# Patient Record
Sex: Male | Born: 1975
Health system: Southern US, Community
[De-identification: ages and names within clinical notes are randomized; demographics above are authoritative.]

## PROBLEM LIST (undated history)

## (undated) DIAGNOSIS — F329 Major depressive disorder, single episode, unspecified: Secondary | ICD-10-CM

## (undated) DIAGNOSIS — E079 Disorder of thyroid, unspecified: Secondary | ICD-10-CM

## (undated) DIAGNOSIS — E039 Hypothyroidism, unspecified: Secondary | ICD-10-CM

## (undated) DIAGNOSIS — E119 Type 2 diabetes mellitus without complications: Secondary | ICD-10-CM

## (undated) DIAGNOSIS — I10 Essential (primary) hypertension: Secondary | ICD-10-CM

## (undated) DIAGNOSIS — F32A Depression, unspecified: Secondary | ICD-10-CM

## (undated) DIAGNOSIS — F909 Attention-deficit hyperactivity disorder, unspecified type: Secondary | ICD-10-CM

## (undated) DIAGNOSIS — E785 Hyperlipidemia, unspecified: Secondary | ICD-10-CM

## (undated) HISTORY — DX: Type 2 diabetes mellitus without complications: E11.9

## (undated) HISTORY — DX: Disorder of thyroid, unspecified: E07.9

## (undated) HISTORY — DX: Essential (primary) hypertension: I10

## (undated) HISTORY — DX: Hyperlipidemia, unspecified: E78.5

## (undated) HISTORY — PX: HERNIA REPAIR: SHX51

---
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
## 2013-07-10 ENCOUNTER — Encounter (INDEPENDENT_AMBULATORY_CARE_PROVIDER_SITE_OTHER): Payer: Self-pay | Admitting: Surgery

## 2013-07-10 ENCOUNTER — Ambulatory Visit (INDEPENDENT_AMBULATORY_CARE_PROVIDER_SITE_OTHER): Payer: BC Managed Care – PPO | Admitting: Surgery

## 2013-07-10 VITALS — BP 130/78 | HR 80 | Temp 97.8°F | Resp 16 | Ht 75.0 in | Wt 298.4 lb

## 2013-07-10 DIAGNOSIS — K429 Umbilical hernia without obstruction or gangrene: Secondary | ICD-10-CM

## 2013-07-10 NOTE — Progress Notes (Signed)
Patient ID: Derek Prince Forand, male   DOB: 01/12/1976, 38 y.o.   MRN: [id number]  Chief Complaint  Patient presents with  . Umbilical Hernia    HPI Derek Prince Caudell is a 38 y.o. male.  Patient sent at the request of Dr. Kateri PlummerMorrow for umbilical hernia. Patient has had hernia for a number of years. Is getting larger. He does notice a little clear drainage from it. It causes mild to moderate discomfort. The patient umbilicus. 3/10 pain made worse with coughing or sneezing. HPI  Past Medical History  Diagnosis Date  . Thyroid disease     History reviewed. No pertinent past surgical history.  History reviewed. No pertinent family history.  Social History History  Substance Use Topics  . Smoking status: Current Every Day Smoker  . Smokeless tobacco: Not on file  . Alcohol Use: Yes    No Known Allergies  Current Outpatient Prescriptions  Medication Sig Dispense Refill  . levothyroxine (SYNTHROID, LEVOTHROID) 200 MCG tablet       . lisdexamfetamine (VYVANSE) 30 MG capsule Take 30 mg by mouth daily.       No current facility-administered medications for this visit.    Review of Systems Review of Systems  Constitutional: Negative for fever, chills and unexpected weight change.  HENT: Negative for congestion, hearing loss, sore throat, trouble swallowing and voice change.   Eyes: Negative for visual disturbance.  Respiratory: Negative for cough and wheezing.   Cardiovascular: Negative for chest pain, palpitations and leg swelling.  Gastrointestinal: Negative for nausea, vomiting, abdominal pain, diarrhea, constipation, blood in stool, abdominal distention, anal bleeding and rectal pain.  Genitourinary: Negative for hematuria and difficulty urinating.  Musculoskeletal: Negative for arthralgias.  Skin: Negative for rash and wound.  Neurological: Negative for seizures, syncope, weakness and headaches.  Hematological: Negative for adenopathy. Does not bruise/bleed easily.    Psychiatric/Behavioral: Negative for confusion.    Blood pressure 130/78, pulse 80, temperature 97.8 F (36.6 C), resp. rate 16, height 6\' 3"  (1.905 m), weight 298 lb 6.4 oz (135.353 kg).  Physical Exam Physical Exam  Constitutional: He is oriented to person, place, and time. He appears well-developed and well-nourished.  HENT:  Head: Normocephalic and atraumatic.  Eyes: Pupils are equal, round, and reactive to light. No scleral icterus.  Neck: Normal range of motion. Neck supple.  Cardiovascular: Normal rate and regular rhythm.   Pulmonary/Chest: Effort normal and breath sounds normal.  Abdominal: Soft. There is no tenderness.    Musculoskeletal: Normal range of motion.  Neurological: He is alert and oriented to person, place, and time.  Skin: Skin is warm.  Psychiatric: He has a normal mood and affect. His behavior is normal. Judgment and thought content normal.    Data Reviewed none  Assessment    Umbilical hernia reducible and symptomatic    Plan    Recommend repair of umbilical hernia to  the patient. Patient states he has no one to bring him or stay with him. I recommend that someone take him,  Bring  Him home and stay with him at home. He states he cannot do that.  I offered overnight admission to him and he refused.  He chose not to repair the  hernia at this time.  Information about hernias given which include signs of incarceration or strangulation and what to do if symptoms return.  Pt told to contact office if he changes his mind.        Marshawn Ninneman A. Cheryel Kyte 07/10/2013, 10:38 AM

## 2013-07-10 NOTE — Patient Instructions (Signed)
Hernia A hernia occurs when an internal organ pushes out through a weak spot in the abdominal wall. Hernias most commonly occur in the groin and around the navel. Hernias often can be pushed back into place (reduced). Most hernias tend to get worse over time. Some abdominal hernias can get stuck in the opening (irreducible or incarcerated hernia) and cannot be reduced. An irreducible abdominal hernia which is tightly squeezed into the opening is at risk for impaired blood supply (strangulated hernia). A strangulated hernia is a medical emergency. Because of the risk for an irreducible or strangulated hernia, surgery may be recommended to repair a hernia. CAUSES   Heavy lifting.  Prolonged coughing.  Straining to have a bowel movement.  A cut (incision) made during an abdominal surgery. HOME CARE INSTRUCTIONS   Bed rest is not required. You may continue your normal activities.  Avoid lifting more than 10 pounds (4.5 kg) or straining.  Cough gently. If you are a smoker it is best to stop. Even the best hernia repair can break down with the continual strain of coughing. Even if you do not have your hernia repaired, a cough will continue to aggravate the problem.  Do not wear anything tight over your hernia. Do not try to keep it in with an outside bandage or truss. These can damage abdominal contents if they are trapped within the hernia sac.  Eat a normal diet.  Avoid constipation. Straining over long periods of time will increase hernia size and encourage breakdown of repairs. If you cannot do this with diet alone, stool softeners may be used. SEEK IMMEDIATE MEDICAL CARE IF:   You have a fever.  You develop increasing abdominal pain.  You feel nauseous or vomit.  Your hernia is stuck outside the abdomen, looks discolored, feels hard, or is tender.  You have any changes in your bowel habits or in the hernia that are unusual for you.  You have increased pain or swelling around the  hernia.  You cannot push the hernia back in place by applying gentle pressure while lying down. MAKE SURE YOU:   Understand these instructions.  Will watch your condition.  Will get help right away if you are not doing well or get worse. Document Released: 02/26/2005 Document Revised: 05/21/2011 Document Reviewed: 10/16/2007 Exit[employer] Patient Information 2014 [employer].  

## 2015-02-28 ENCOUNTER — Other Ambulatory Visit: Payer: Self-pay | Admitting: Surgery

## 2015-06-21 DIAGNOSIS — K429 Umbilical hernia without obstruction or gangrene: Secondary | ICD-10-CM | POA: Diagnosis not present

## 2015-06-21 DIAGNOSIS — K420 Umbilical hernia with obstruction, without gangrene: Secondary | ICD-10-CM | POA: Diagnosis not present

## 2015-07-30 DIAGNOSIS — M545 Low back pain: Secondary | ICD-10-CM | POA: Diagnosis not present

## 2015-07-30 DIAGNOSIS — E559 Vitamin D deficiency, unspecified: Secondary | ICD-10-CM | POA: Diagnosis not present

## 2015-07-30 DIAGNOSIS — Z0000 Encounter for general adult medical examination without abnormal findings: Secondary | ICD-10-CM | POA: Diagnosis not present

## 2015-07-30 DIAGNOSIS — R5383 Other fatigue: Secondary | ICD-10-CM | POA: Diagnosis not present

## 2015-07-30 DIAGNOSIS — R0602 Shortness of breath: Secondary | ICD-10-CM | POA: Diagnosis not present

## 2015-08-01 DIAGNOSIS — M549 Dorsalgia, unspecified: Secondary | ICD-10-CM | POA: Diagnosis not present

## 2015-08-01 DIAGNOSIS — R0602 Shortness of breath: Secondary | ICD-10-CM | POA: Diagnosis not present

## 2015-08-01 DIAGNOSIS — R197 Diarrhea, unspecified: Secondary | ICD-10-CM | POA: Diagnosis not present

## 2015-08-02 ENCOUNTER — Other Ambulatory Visit: Payer: Self-pay | Admitting: Family Medicine

## 2015-08-02 ENCOUNTER — Ambulatory Visit
Admission: RE | Admit: 2015-08-02 | Discharge: 2015-08-02 | Disposition: A | Discharge status: Home / Self Care | Payer: BLUE CROSS/BLUE SHIELD | Source: Ambulatory Visit | Attending: Family Medicine | Admitting: Family Medicine

## 2015-08-02 DIAGNOSIS — M549 Dorsalgia, unspecified: Secondary | ICD-10-CM | POA: Diagnosis not present

## 2015-08-02 DIAGNOSIS — R197 Diarrhea, unspecified: Secondary | ICD-10-CM | POA: Diagnosis not present

## 2015-08-02 DIAGNOSIS — R0602 Shortness of breath: Secondary | ICD-10-CM

## 2015-08-02 MED ORDER — IOPAMIDOL (ISOVUE-370) INJECTION 76%
100.0000 mL | Freq: Once | INTRAVENOUS | Status: AC | PRN
  Administered 2015-08-02: 100 mL via INTRAVENOUS

## 2015-08-26 DIAGNOSIS — M549 Dorsalgia, unspecified: Secondary | ICD-10-CM | POA: Diagnosis not present

## 2015-08-26 DIAGNOSIS — Z7984 Long term (current) use of oral hypoglycemic drugs: Secondary | ICD-10-CM | POA: Diagnosis not present

## 2015-08-26 DIAGNOSIS — E1165 Type 2 diabetes mellitus with hyperglycemia: Secondary | ICD-10-CM | POA: Diagnosis not present

## 2015-08-26 DIAGNOSIS — E039 Hypothyroidism, unspecified: Secondary | ICD-10-CM | POA: Diagnosis not present

## 2015-12-02 DIAGNOSIS — Z23 Encounter for immunization: Secondary | ICD-10-CM | POA: Diagnosis not present

## 2015-12-02 DIAGNOSIS — F909 Attention-deficit hyperactivity disorder, unspecified type: Secondary | ICD-10-CM | POA: Diagnosis not present

## 2015-12-02 DIAGNOSIS — E039 Hypothyroidism, unspecified: Secondary | ICD-10-CM | POA: Diagnosis not present

## 2015-12-02 DIAGNOSIS — Z7984 Long term (current) use of oral hypoglycemic drugs: Secondary | ICD-10-CM | POA: Diagnosis not present

## 2015-12-02 DIAGNOSIS — E1165 Type 2 diabetes mellitus with hyperglycemia: Secondary | ICD-10-CM | POA: Diagnosis not present

## 2016-01-03 DIAGNOSIS — F4323 Adjustment disorder with mixed anxiety and depressed mood: Secondary | ICD-10-CM | POA: Diagnosis not present

## 2016-01-24 DIAGNOSIS — F4323 Adjustment disorder with mixed anxiety and depressed mood: Secondary | ICD-10-CM | POA: Diagnosis not present

## 2016-01-26 DIAGNOSIS — E1165 Type 2 diabetes mellitus with hyperglycemia: Secondary | ICD-10-CM | POA: Diagnosis not present

## 2016-01-26 DIAGNOSIS — F909 Attention-deficit hyperactivity disorder, unspecified type: Secondary | ICD-10-CM | POA: Diagnosis not present

## 2016-01-26 DIAGNOSIS — E039 Hypothyroidism, unspecified: Secondary | ICD-10-CM | POA: Diagnosis not present

## 2016-01-26 DIAGNOSIS — Z7984 Long term (current) use of oral hypoglycemic drugs: Secondary | ICD-10-CM | POA: Diagnosis not present

## 2016-02-07 DIAGNOSIS — F4323 Adjustment disorder with mixed anxiety and depressed mood: Secondary | ICD-10-CM | POA: Diagnosis not present

## 2016-05-03 DIAGNOSIS — R52 Pain, unspecified: Secondary | ICD-10-CM | POA: Diagnosis not present

## 2016-05-03 DIAGNOSIS — F909 Attention-deficit hyperactivity disorder, unspecified type: Secondary | ICD-10-CM | POA: Diagnosis not present

## 2016-05-03 DIAGNOSIS — E1165 Type 2 diabetes mellitus with hyperglycemia: Secondary | ICD-10-CM | POA: Diagnosis not present

## 2016-05-03 DIAGNOSIS — Z7984 Long term (current) use of oral hypoglycemic drugs: Secondary | ICD-10-CM | POA: Diagnosis not present

## 2016-05-03 DIAGNOSIS — E039 Hypothyroidism, unspecified: Secondary | ICD-10-CM | POA: Diagnosis not present

## 2016-05-15 DIAGNOSIS — M67814 Other specified disorders of tendon, left shoulder: Secondary | ICD-10-CM | POA: Diagnosis not present

## 2016-05-28 DIAGNOSIS — F411 Generalized anxiety disorder: Secondary | ICD-10-CM | POA: Diagnosis not present

## 2016-05-29 DIAGNOSIS — F411 Generalized anxiety disorder: Secondary | ICD-10-CM | POA: Diagnosis not present

## 2016-05-30 ENCOUNTER — Encounter: Payer: Self-pay | Admitting: Endocrinology

## 2016-05-30 ENCOUNTER — Ambulatory Visit: Payer: BLUE CROSS/BLUE SHIELD | Admitting: Endocrinology

## 2016-05-30 DIAGNOSIS — M25512 Pain in left shoulder: Secondary | ICD-10-CM | POA: Diagnosis not present

## 2016-05-30 DIAGNOSIS — Z765 Malingerer [conscious simulation]: Secondary | ICD-10-CM | POA: Diagnosis not present

## 2016-05-30 DIAGNOSIS — E119 Type 2 diabetes mellitus without complications: Secondary | ICD-10-CM | POA: Diagnosis not present

## 2016-06-11 DIAGNOSIS — F411 Generalized anxiety disorder: Secondary | ICD-10-CM | POA: Diagnosis not present

## 2016-06-12 DIAGNOSIS — F411 Generalized anxiety disorder: Secondary | ICD-10-CM | POA: Diagnosis not present

## 2016-06-25 DIAGNOSIS — F4323 Adjustment disorder with mixed anxiety and depressed mood: Secondary | ICD-10-CM | POA: Diagnosis not present

## 2016-06-26 DIAGNOSIS — F4323 Adjustment disorder with mixed anxiety and depressed mood: Secondary | ICD-10-CM | POA: Diagnosis not present

## 2016-07-09 DIAGNOSIS — F411 Generalized anxiety disorder: Secondary | ICD-10-CM | POA: Diagnosis not present

## 2016-07-10 DIAGNOSIS — F411 Generalized anxiety disorder: Secondary | ICD-10-CM | POA: Diagnosis not present

## 2016-08-10 DIAGNOSIS — E785 Hyperlipidemia, unspecified: Secondary | ICD-10-CM | POA: Diagnosis not present

## 2016-08-10 DIAGNOSIS — F909 Attention-deficit hyperactivity disorder, unspecified type: Secondary | ICD-10-CM | POA: Diagnosis not present

## 2016-08-10 DIAGNOSIS — E1165 Type 2 diabetes mellitus with hyperglycemia: Secondary | ICD-10-CM | POA: Diagnosis not present

## 2016-08-10 DIAGNOSIS — E039 Hypothyroidism, unspecified: Secondary | ICD-10-CM | POA: Diagnosis not present

## 2016-08-28 ENCOUNTER — Other Ambulatory Visit: Payer: Self-pay | Admitting: Family Medicine

## 2016-08-28 DIAGNOSIS — R911 Solitary pulmonary nodule: Secondary | ICD-10-CM

## 2016-09-03 ENCOUNTER — Other Ambulatory Visit: Payer: BLUE CROSS/BLUE SHIELD

## 2016-10-26 DIAGNOSIS — E119 Type 2 diabetes mellitus without complications: Secondary | ICD-10-CM | POA: Diagnosis not present

## 2016-10-26 DIAGNOSIS — N529 Male erectile dysfunction, unspecified: Secondary | ICD-10-CM | POA: Diagnosis not present

## 2016-10-26 DIAGNOSIS — R079 Chest pain, unspecified: Secondary | ICD-10-CM | POA: Diagnosis not present

## 2016-10-26 DIAGNOSIS — F064 Anxiety disorder due to known physiological condition: Secondary | ICD-10-CM | POA: Diagnosis not present

## 2016-11-02 DIAGNOSIS — E039 Hypothyroidism, unspecified: Secondary | ICD-10-CM | POA: Diagnosis not present

## 2016-11-02 DIAGNOSIS — E785 Hyperlipidemia, unspecified: Secondary | ICD-10-CM | POA: Diagnosis not present

## 2016-11-02 DIAGNOSIS — Z23 Encounter for immunization: Secondary | ICD-10-CM | POA: Diagnosis not present

## 2016-11-02 DIAGNOSIS — E1165 Type 2 diabetes mellitus with hyperglycemia: Secondary | ICD-10-CM | POA: Diagnosis not present

## 2016-12-05 DIAGNOSIS — E669 Obesity, unspecified: Secondary | ICD-10-CM | POA: Insufficient documentation

## 2016-12-05 DIAGNOSIS — E1165 Type 2 diabetes mellitus with hyperglycemia: Secondary | ICD-10-CM | POA: Insufficient documentation

## 2016-12-26 DIAGNOSIS — E039 Hypothyroidism, unspecified: Secondary | ICD-10-CM | POA: Diagnosis not present

## 2016-12-26 DIAGNOSIS — L309 Dermatitis, unspecified: Secondary | ICD-10-CM | POA: Diagnosis not present

## 2016-12-26 DIAGNOSIS — E1165 Type 2 diabetes mellitus with hyperglycemia: Secondary | ICD-10-CM | POA: Diagnosis not present

## 2017-02-05 DIAGNOSIS — E1165 Type 2 diabetes mellitus with hyperglycemia: Secondary | ICD-10-CM | POA: Diagnosis not present

## 2017-02-05 DIAGNOSIS — E039 Hypothyroidism, unspecified: Secondary | ICD-10-CM | POA: Diagnosis not present

## 2017-03-19 DIAGNOSIS — E1165 Type 2 diabetes mellitus with hyperglycemia: Secondary | ICD-10-CM | POA: Diagnosis not present

## 2017-03-19 DIAGNOSIS — E039 Hypothyroidism, unspecified: Secondary | ICD-10-CM | POA: Diagnosis not present

## 2017-03-29 DIAGNOSIS — E669 Obesity, unspecified: Secondary | ICD-10-CM | POA: Diagnosis not present

## 2017-03-29 DIAGNOSIS — E1165 Type 2 diabetes mellitus with hyperglycemia: Secondary | ICD-10-CM | POA: Diagnosis not present

## 2017-03-29 DIAGNOSIS — E039 Hypothyroidism, unspecified: Secondary | ICD-10-CM | POA: Diagnosis not present

## 2017-03-29 DIAGNOSIS — F909 Attention-deficit hyperactivity disorder, unspecified type: Secondary | ICD-10-CM | POA: Diagnosis not present

## 2017-04-04 DIAGNOSIS — E039 Hypothyroidism, unspecified: Secondary | ICD-10-CM | POA: Diagnosis not present

## 2017-04-04 DIAGNOSIS — G8929 Other chronic pain: Secondary | ICD-10-CM | POA: Diagnosis not present

## 2017-04-04 DIAGNOSIS — M545 Low back pain: Secondary | ICD-10-CM | POA: Diagnosis not present

## 2017-04-05 ENCOUNTER — Other Ambulatory Visit (HOSPITAL_COMMUNITY): Payer: Self-pay | Admitting: General Surgery

## 2017-04-23 ENCOUNTER — Ambulatory Visit (HOSPITAL_COMMUNITY): Payer: BLUE CROSS/BLUE SHIELD

## 2017-04-23 ENCOUNTER — Encounter (HOSPITAL_COMMUNITY): Payer: Self-pay

## 2017-04-25 ENCOUNTER — Encounter: Payer: Self-pay | Admitting: Skilled Nursing Facility1

## 2017-04-25 ENCOUNTER — Encounter: Payer: BLUE CROSS/BLUE SHIELD | Attending: General Surgery | Admitting: Skilled Nursing Facility1

## 2017-04-25 DIAGNOSIS — Z6841 Body Mass Index (BMI) 40.0 and over, adult: Secondary | ICD-10-CM | POA: Diagnosis not present

## 2017-04-25 DIAGNOSIS — F4320 Adjustment disorder, unspecified: Secondary | ICD-10-CM | POA: Diagnosis not present

## 2017-04-25 DIAGNOSIS — Z713 Dietary counseling and surveillance: Secondary | ICD-10-CM | POA: Diagnosis not present

## 2017-04-25 DIAGNOSIS — E119 Type 2 diabetes mellitus without complications: Secondary | ICD-10-CM

## 2017-04-25 NOTE — Progress Notes (Signed)
Pre-Op Assessment Visit:  Pre-Operative RYGB Surgery  Medical Nutrition Therapy:  Appt start time: 4:22  End time:  5:22  Patient was seen on 04/25/2017 for Pre-Operative Nutrition Assessment. Assessment [facility] on 04/25/2017.   Pt seemed to be uncomfortable with eye contact and seemed more abrasive in his speech than he actually means. Pt is struggling with an ex wife and his 43 year old daughter. Pt states he feels he has nothing but bad luck. Pt states he does not have time to work out because he is too busy. Pt states he is under a tremendous amount of stress. Pt was getting visibly more upset as the appointment went.  Pt stated he is having the surgery because it will cure his diabetes, pt states he is going to have the surgery because it will automatically cure his diabetes and give him more energy (without making any lifestyle changes and controlling his stress). Pt states he is under too much stress to have the surgery and states what is the point in having the surgery if he cannot fix the root cause of his stress. Pt states cancel all of my paperwork I do not want to have surgery and abruptly got up and rushed out with red swollen eyes (appearnig to begin to cry-perhaps not wanting dietitian to see him cry).   Pt expectation of surgery: to cure diabetes   Start weight at [facility]: 322.5 BMI: 40.31  24 hr Dietary Recall: First Meal: fast food sandwich Snack:  Second Meal: skipped Snack:  Third Meal: skipped Snack: snacking throughout the night Beverages: coffee  Encouraged to engage in 150 minutes of moderate physical activity including cardiovascular and weight baring weekly  Diabetes education was reviewed as well as research supported/evidence based expectations of metabolic surgery.

## 2017-05-30 DIAGNOSIS — E039 Hypothyroidism, unspecified: Secondary | ICD-10-CM | POA: Diagnosis not present

## 2017-05-30 DIAGNOSIS — E1165 Type 2 diabetes mellitus with hyperglycemia: Secondary | ICD-10-CM | POA: Diagnosis not present

## 2017-05-30 DIAGNOSIS — E1140 Type 2 diabetes mellitus with diabetic neuropathy, unspecified: Secondary | ICD-10-CM | POA: Diagnosis not present

## 2017-06-12 ENCOUNTER — Encounter: Payer: BLUE CROSS/BLUE SHIELD | Attending: General Surgery | Admitting: Registered"

## 2017-06-12 ENCOUNTER — Encounter: Payer: Self-pay | Admitting: Registered"

## 2017-06-12 DIAGNOSIS — Z713 Dietary counseling and surveillance: Secondary | ICD-10-CM | POA: Insufficient documentation

## 2017-06-12 DIAGNOSIS — Z6841 Body Mass Index (BMI) 40.0 and over, adult: Secondary | ICD-10-CM | POA: Diagnosis not present

## 2017-06-12 DIAGNOSIS — E119 Type 2 diabetes mellitus without complications: Secondary | ICD-10-CM

## 2017-06-12 NOTE — Progress Notes (Signed)
RYGB Appt start time: 4:05 end time: 4:30  Assessment: 1st SWL Appointment.   Start Wt at [facility]:  322.5 Wt: 321.6 BMI: 40.20   Pt arrives having maintained weight from previous visit.   Pt states he does not know how many SWL visits he needs prior to surgery. Pt states he needs information on: Pre-op Goals, Protein Shakes, and Vitamins and Minerals. Pt states he typically does not eat during the day. 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 Pt states he has a lot of things going on and he handles them as they come.   MEDICATIONS: See list   DIETARY INTAKE:  24 hr Dietary Recall: First Meal: fast food sandwich Snack:  Second Meal: skipped Snack:  Third Meal: skipped Snack: snacking throughout the night Beverages: coffee, diet soda  Usual physical activity: none stated  Diet to Follow: 1800 calories 200 g carbohydrates 135 g protein 50 g fat  Preferred Learning Style:   No preference indicated   Learning Readiness:   Contemplating  Ready  Handouts given during visit include:  . Pre-Op Goals  During the appointment today the following Pre-Op Goals were reviewed with the patient: . Track your food and beverage: MyFitness Pal or Baritastic App . Make healthy food choices . Begin to limit portion sizes . Limited concentrated sugars and fried foods . Keep fat/sugar in the single digits per serving on food labels . Practice CHEWING your food  (aim for 30 chews per bite or until applesauce consistency)  Start here for 2nd SWL visit: . Practice not drinking 15 minutes before, during, and 30 minutes after each meal/snack . Avoid all carbonated beverages  . Avoid/limit caffeinated beverages  . Avoid all sugar-sweetened beverages . Avoid alcohol . Consume 3 meals per day; eat every 3-5 hours . Make a list of non-food related activities . Aim for 64-100 ounces of FLUID daily  . Aim for at least 60-80 grams of PROTEIN daily . Look for a liquid protein source that contain ?15 g  protein and ?5 g carbohydrate  (ex: shakes, drinks, shots) . Physical activity is an important part of a healthy lifestyle so keep it moving!  Nutritional Diagnosis:  [place]-3.3 Overweight/obesity related to past poor dietary habits and physical inactivity as evidenced by patient w/ planned RYGB surgery following dietary guidelines for continued weight loss.    Intervention:  Nutrition counseling for upcoming Bariatric Surgery.  Goals:  - Aim for 150 minutes of physical activity including cardio and weight bearing every week - Aim to track food using app.  - Keep fat and sugar in single digits per serving on nutrition facts label.   Teaching Method Utilized:  Visual Auditory Hands on  Handouts given during visit include:  Pre-op Goals  Barriers to learning/adherence to lifestyle change: none identified  Demonstrated degree of understanding via:  Teach Back   Monitoring/Evaluation:  Dietary intake, exercise, and body weight in 1 month(s).

## 2017-06-12 NOTE — Patient Instructions (Addendum)
-   Aim to track food using app.   - Keep fat and sugar in single digits per serving on nutrition facts label.

## 2017-06-20 DIAGNOSIS — Z0000 Encounter for general adult medical examination without abnormal findings: Secondary | ICD-10-CM | POA: Diagnosis not present

## 2017-07-17 ENCOUNTER — Encounter: Payer: Self-pay | Admitting: Registered"

## 2017-07-17 ENCOUNTER — Encounter: Payer: BLUE CROSS/BLUE SHIELD | Attending: General Surgery | Admitting: Registered"

## 2017-07-17 DIAGNOSIS — Z6841 Body Mass Index (BMI) 40.0 and over, adult: Secondary | ICD-10-CM | POA: Diagnosis not present

## 2017-07-17 DIAGNOSIS — E119 Type 2 diabetes mellitus without complications: Secondary | ICD-10-CM

## 2017-07-17 DIAGNOSIS — Z713 Dietary counseling and surveillance: Secondary | ICD-10-CM | POA: Insufficient documentation

## 2017-07-17 NOTE — Patient Instructions (Addendum)
-   Find out about how many visits needed prior to having surgery.   - Aim to eat every 3-5 hours.   - Continue to track food using the App.   - Practice not drinking 15 minutes before, during, and 30 minutes after each meal/snack  - Reduce diet soda intake to about 6 bottles a day. Replace with water.   - Find at least 2-3 flavors of protein shakes that you enjoy. See handout.

## 2017-07-17 NOTE — Progress Notes (Signed)
RYGB Appt start time: 4:05 end time: 4:30  Assessment: 2nd SWL Appointment.   Start Wt at [facility]:  322.5 Wt: 320.9 BMI: 40.11   Pt arrives having maintained weight from previous visit.   Pt states he tracked food a little bit and then became too busy. Pt states he has started trying to figure out healthier food options. Pt states he has reduced fast food options. Pt states he does not take his ADHD medications on the weekends. Pt states he normally eats when he is bored. Pt states he is doing well with chewing 30 times per bite. Pt states his BS numbers have improved since taking newer medications; numbers averaging 150's during the day.   Pt states he does not know how many SWL visits he needs prior to surgery. Pt states he needs information on: Pre-op Goals, Protein Shakes, and Vitamins and Minerals. Pt states he typically does not eat during the day. 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 Pt states he has a lot of things going on and he handles them as they come.   MEDICATIONS: See list   DIETARY INTAKE:  24 hr Dietary Recall: First Meal: fast food sandwich or cheese grits + eggs Snack:  Second Meal: salad or chicken + vegetables Snack:  Third Meal: meat with spicy pepper sauce, brown rice, collard greens  Snack: snacking throughout the night Beverages: coffee, diet soda (10-12 20 oz bottles/day)  Usual physical activity: none stated  Diet to Follow: 1800 calories 200 g carbohydrates 135 g protein 50 g fat  Preferred Learning Style:   No preference indicated   Learning Readiness:   Contemplating  Ready  Handouts given during visit include:  . Pre-Op Goals  During the appointment today the following Pre-Op Goals were reviewed with the patient: . Track your food and beverage: MyFitness Pal or Baritastic App . Make healthy food choices . Begin to limit portion sizes . Limited concentrated sugars and fried foods . Keep fat/sugar in the single digits per serving on food  labels . Practice CHEWING your food  (aim for 30 chews per bite or until applesauce consistency) . Practice not drinking 15 minutes before, during, and 30 minutes after each meal/snack . Avoid all carbonated beverages  . Avoid/limit caffeinated beverages  . Avoid all sugar-sweetened beverages . Look for a liquid protein source that contains at least 15 grams or more of protein and 5 grams or less of carbohydrates (ex: shakes, drinks, shots, powders)   Start here for next visit:  . Avoid alcohol . Consume 3 meals per day; eat every 3-5 hours . Make a list of non-food related activities . Aim for 64-100 ounces of FLUID daily  . Aim for at least 60-80 grams of PROTEIN daily . Physical activity is an important part of a healthy lifestyle so keep it moving!  Nutritional Diagnosis:  [place]-3.3 Overweight/obesity related to past poor dietary habits and physical inactivity as evidenced by patient w/ planned RYGB surgery following dietary guidelines for continued weight loss.    Intervention:  Nutrition counseling for upcoming Bariatric Surgery. Pt was educated and counseled on the importance of not drinking around meals/snacks after bariatric surgery, tracking food and drink intake, reducing carbonation and caffeine intake, appropriate liquid protein options, and finding 2-3 flavors that he enjoys.  Goals:  - Find out about how many visits needed prior to having surgery.  - Aim to eat every 3-5 hours.  - Continue to track food using the App.  - Practice  not drinking 15 minutes before, during, and 30 minutes after each meal/snack - Reduce diet soda intake to about 6 bottles a day. Replace with water.  - Find at least 2-3 flavors of protein shakes that you enjoy. See handout.   Teaching Method Utilized:  Visual Auditory Hands on  Handouts given during visit include:  Pre-op Goals  Protein Shakes  Bariatric Support Group  Barriers to learning/adherence to lifestyle change: none  identified  Demonstrated degree of understanding via:  Teach Back   Monitoring/Evaluation:  Dietary intake, exercise, and body weight in 1 month(s).

## 2017-08-12 DIAGNOSIS — E1140 Type 2 diabetes mellitus with diabetic neuropathy, unspecified: Secondary | ICD-10-CM | POA: Diagnosis not present

## 2017-08-12 DIAGNOSIS — E1165 Type 2 diabetes mellitus with hyperglycemia: Secondary | ICD-10-CM | POA: Diagnosis not present

## 2017-08-12 DIAGNOSIS — E039 Hypothyroidism, unspecified: Secondary | ICD-10-CM | POA: Diagnosis not present

## 2017-08-14 ENCOUNTER — Encounter: Payer: BLUE CROSS/BLUE SHIELD | Attending: General Surgery | Admitting: Registered"

## 2017-08-14 DIAGNOSIS — Z713 Dietary counseling and surveillance: Secondary | ICD-10-CM | POA: Diagnosis not present

## 2017-08-14 DIAGNOSIS — E119 Type 2 diabetes mellitus without complications: Secondary | ICD-10-CM

## 2017-08-14 DIAGNOSIS — Z6841 Body Mass Index (BMI) 40.0 and over, adult: Secondary | ICD-10-CM | POA: Diagnosis not present

## 2017-08-14 NOTE — Progress Notes (Signed)
RYGB Appt start time: 4:00 end time: 4:25  Assessment: 3rd SWL Appointment.   Start Wt at [facility]:  322.5 Wt: 320.5 BMI: 40.06   Pt arrives having maintained weight from previous visit.    Pt states his A1c has decreased 10.5 to 8.4. Pt states he does not sip anything. Pt states he has reduced soda intake from 8/day to 4/day; drinking more water. Pt states he has to figure out his schedule with working during the day and being able to sip throughout the day. Pt states he does well with being intentional of taking time away from work to eat mindfully. 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 Pt states he has reduced coffee to 1 cup every couple of days.   Pt states he contacted insurance company about SWL visits and there is no set number on visits. Pt states he is more open to not having surgery at this point. Pt states he feels that he is not ready yet and wants to keep working until he feels ready. Pt is concerned about wasting others time. Pt was encouraged to know that he is not wasting our time; we are here to serve him and want what is best for him regardless of how long it takes.  Pt states he does not take his ADHD medications on the weekends. Pt states he normally eats when he is bored. Pt states he is doing well with chewing 30 times per bite. Pt states his BS numbers have improved since taking newer medications; numbers averaging 150's during the day.   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 Pt states he has a lot of things going on and he handles them as they come.   MEDICATIONS: See list   DIETARY INTAKE:  24 hr Dietary Recall: First Meal: fast food sandwich or cheese grits + eggs Snack:  Second Meal: salad or chicken + vegetables Snack:  Third Meal: meat with spicy pepper sauce, brown rice, collard greens  Snack: snacking throughout the night Beverages: coffee, diet  soda (10-12 20 oz bottles/day)  Usual physical activity: none stated  Diet to Follow: 1800 calories 200 g carbohydrates 135 g protein 50 g fat  Preferred Learning Style:   No preference indicated   Learning Readiness:   Contemplating  Ready  Nutritional Diagnosis:  [place]-3.3 Overweight/obesity related to past poor dietary habits and physical inactivity as evidenced by patient w/ planned RYGB surgery following dietary guidelines for continued weight loss.    Intervention:  Nutrition counseling for upcoming Bariatric Surgery. Pt was educated and counseled on the vitamin and mineral recommendations related to bariatric surgery, the importance of not gulping fluids after surgery, and continuing to work on chewing well, decreasing carbonation and caffeine intake.  Goals:   Look for a liquid protein source that contains at least 15 grams or more of protein and 5 grams or less of carbohydrates (ex: shakes, drinks, shots, powders)  Continue to work on sipping throughout your day; aiming for at least 64 ounces of fluid a day.   Aim to track food intake.   Remember to chew at least 30 times per bite.    Teaching Method Utilized:  Visual Auditory Hands on  Handouts given during visit include:  Vitamin and Mineral Recommendations  Barriers to learning/adherence to lifestyle change: none identified  Demonstrated degree of understanding via:  Teach Back  Monitoring/Evaluation:  Dietary intake, exercise, and body weight in 1 month(s).

## 2017-08-14 NOTE — Patient Instructions (Signed)
   Look for a liquid protein source that contains at least 15 grams or more of protein and 5 grams or less of carbohydrates (ex: shakes, drinks, shots, powders)   Continue to work on sipping throughout your day; aiming for at least 64 ounces of fluid a day.    Aim to track food intake.    Remember to chew at least 30 times per bite.

## 2017-09-16 ENCOUNTER — Ambulatory Visit: Payer: BLUE CROSS/BLUE SHIELD | Admitting: Registered"

## 2017-10-31 DIAGNOSIS — E1165 Type 2 diabetes mellitus with hyperglycemia: Secondary | ICD-10-CM | POA: Diagnosis not present

## 2017-10-31 DIAGNOSIS — E039 Hypothyroidism, unspecified: Secondary | ICD-10-CM | POA: Diagnosis not present

## 2017-10-31 DIAGNOSIS — F909 Attention-deficit hyperactivity disorder, unspecified type: Secondary | ICD-10-CM | POA: Diagnosis not present

## 2017-10-31 DIAGNOSIS — E669 Obesity, unspecified: Secondary | ICD-10-CM | POA: Diagnosis not present

## 2017-12-06 DIAGNOSIS — F4322 Adjustment disorder with anxiety: Secondary | ICD-10-CM | POA: Diagnosis not present

## 2017-12-23 DIAGNOSIS — Z77120 Contact with and (suspected) exposure to mold (toxic): Secondary | ICD-10-CM | POA: Diagnosis not present

## 2017-12-23 DIAGNOSIS — R5383 Other fatigue: Secondary | ICD-10-CM | POA: Diagnosis not present

## 2017-12-23 DIAGNOSIS — Z23 Encounter for immunization: Secondary | ICD-10-CM | POA: Diagnosis not present

## 2018-01-30 DIAGNOSIS — F4320 Adjustment disorder, unspecified: Secondary | ICD-10-CM | POA: Diagnosis not present

## 2018-01-30 DIAGNOSIS — Z125 Encounter for screening for malignant neoplasm of prostate: Secondary | ICD-10-CM | POA: Diagnosis not present

## 2018-01-30 DIAGNOSIS — R5383 Other fatigue: Secondary | ICD-10-CM | POA: Diagnosis not present

## 2018-01-30 DIAGNOSIS — E039 Hypothyroidism, unspecified: Secondary | ICD-10-CM | POA: Diagnosis not present

## 2018-02-11 DIAGNOSIS — E1140 Type 2 diabetes mellitus with diabetic neuropathy, unspecified: Secondary | ICD-10-CM | POA: Diagnosis not present

## 2018-02-11 DIAGNOSIS — E1165 Type 2 diabetes mellitus with hyperglycemia: Secondary | ICD-10-CM | POA: Diagnosis not present

## 2018-02-11 DIAGNOSIS — E039 Hypothyroidism, unspecified: Secondary | ICD-10-CM | POA: Diagnosis not present

## 2018-02-18 DIAGNOSIS — F4320 Adjustment disorder, unspecified: Secondary | ICD-10-CM | POA: Diagnosis not present

## 2018-02-25 DIAGNOSIS — F4320 Adjustment disorder, unspecified: Secondary | ICD-10-CM | POA: Diagnosis not present

## 2018-03-17 DIAGNOSIS — F4323 Adjustment disorder with mixed anxiety and depressed mood: Secondary | ICD-10-CM | POA: Diagnosis not present

## 2018-03-27 DIAGNOSIS — E1165 Type 2 diabetes mellitus with hyperglycemia: Secondary | ICD-10-CM | POA: Diagnosis not present

## 2018-03-27 DIAGNOSIS — E039 Hypothyroidism, unspecified: Secondary | ICD-10-CM | POA: Diagnosis not present

## 2018-03-27 DIAGNOSIS — F909 Attention-deficit hyperactivity disorder, unspecified type: Secondary | ICD-10-CM | POA: Diagnosis not present

## 2018-03-27 DIAGNOSIS — N529 Male erectile dysfunction, unspecified: Secondary | ICD-10-CM | POA: Diagnosis not present

## 2018-03-29 DIAGNOSIS — F4323 Adjustment disorder with mixed anxiety and depressed mood: Secondary | ICD-10-CM | POA: Diagnosis not present

## 2018-04-01 DIAGNOSIS — F4320 Adjustment disorder, unspecified: Secondary | ICD-10-CM | POA: Diagnosis not present

## 2018-04-08 DIAGNOSIS — F4320 Adjustment disorder, unspecified: Secondary | ICD-10-CM | POA: Diagnosis not present

## 2018-04-15 DIAGNOSIS — F4323 Adjustment disorder with mixed anxiety and depressed mood: Secondary | ICD-10-CM | POA: Diagnosis not present

## 2018-04-17 DIAGNOSIS — E1165 Type 2 diabetes mellitus with hyperglycemia: Secondary | ICD-10-CM | POA: Diagnosis not present

## 2018-04-17 DIAGNOSIS — L089 Local infection of the skin and subcutaneous tissue, unspecified: Secondary | ICD-10-CM | POA: Diagnosis not present

## 2018-04-19 DIAGNOSIS — E669 Obesity, unspecified: Secondary | ICD-10-CM | POA: Diagnosis not present

## 2018-04-19 DIAGNOSIS — S99921A Unspecified injury of right foot, initial encounter: Secondary | ICD-10-CM | POA: Diagnosis not present

## 2018-04-30 ENCOUNTER — Ambulatory Visit
Admission: RE | Admit: 2018-04-30 | Discharge: 2018-04-30 | Disposition: A | Discharge status: Home / Self Care | Payer: BLUE CROSS/BLUE SHIELD | Source: Ambulatory Visit | Attending: Family Medicine | Admitting: Family Medicine

## 2018-04-30 ENCOUNTER — Other Ambulatory Visit: Payer: Self-pay | Admitting: Family Medicine

## 2018-04-30 DIAGNOSIS — L97519 Non-pressure chronic ulcer of other part of right foot with unspecified severity: Principal | ICD-10-CM

## 2018-04-30 DIAGNOSIS — E11621 Type 2 diabetes mellitus with foot ulcer: Secondary | ICD-10-CM

## 2018-04-30 DIAGNOSIS — E1169 Type 2 diabetes mellitus with other specified complication: Secondary | ICD-10-CM | POA: Diagnosis not present

## 2018-04-30 DIAGNOSIS — L97511 Non-pressure chronic ulcer of other part of right foot limited to breakdown of skin: Secondary | ICD-10-CM | POA: Diagnosis not present

## 2018-05-03 ENCOUNTER — Ambulatory Visit: Payer: BLUE CROSS/BLUE SHIELD | Admitting: Podiatry

## 2018-05-03 ENCOUNTER — Other Ambulatory Visit: Payer: Self-pay

## 2018-05-03 ENCOUNTER — Encounter: Payer: Self-pay | Admitting: Podiatry

## 2018-05-03 DIAGNOSIS — E1142 Type 2 diabetes mellitus with diabetic polyneuropathy: Secondary | ICD-10-CM

## 2018-05-03 DIAGNOSIS — L97511 Non-pressure chronic ulcer of other part of right foot limited to breakdown of skin: Secondary | ICD-10-CM | POA: Diagnosis not present

## 2018-05-03 NOTE — Progress Notes (Signed)
This patient presents to the office with chief complaint of an infected diabetic ulcer right great toe.  He states that he develops calluses on both big toes.  He says he was cutting the callus on the right big toe and cut too deep and cut himself 4 weeks ago.  He was treated at [facility] emergency care once the toe became infected.  He was treated with Augmentin initially and the toe has improved.  He says he was seen this past Wednesday by the emergency care and prescribed doxycycline and cephalexin.  He says he has not picked up the second round of antibiotics which he plans to pick up today.  He presents the office today for an evaluation of this infected great toe right foot as well as wishes to discuss the presence of calluses on both forefeet.  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  He presents the office today for an evaluation and treatment of his feet.  This patient is diabetic and states that he stopped using his insulin the last few months and his blood sugar is 280 and his A1c is 10.2.  He presents the office today for an evaluation of his diabetic feet.  He also shows me pictures of the open ulcer on the bottom of his right big toe when he  was initially evaluated.  Vascular  Dorsalis pedis and posterior tibial pulses are palpable  B/L.  Capillary return  WNL.  Temperature gradient is  WNL.  Skin turgor  WNL  Sensorium  Senn Weinstein monofilament wire absent toes but present on the feet.. Normal tactile sensation.  Nail Exam  Patient has normal nails with no evidence of bacterial or fungal infection.  Orthopedic  Exam  Muscle tone and muscle strength  WNL.  No limitations of motion feet  B/L.  No crepitus or joint effusion noted.  Foot type is unremarkable and digits show no abnormalities.  Hallux limitus 1st MPJ  B/L.  Skin    Normal skin texture and turgor.  Pinch callus left hallux.  Diabetic ulcer which measures 4 mm. X 10 mm.  Callus surrounding the open  ulcer right foot. Redness and swelling is evident but resolving based on the pictures seen.  Desquamation noted.  Diabetic ulcer  Cellulitis right hallux  IE.  Diabetic ulcer was debrided and bandaged with neosporin/DSD and padding.  Patient was told to soak and bandage his diabetic ulcer.  Dispensed diabetic insoles.  Told patient he should pick up the doxycycline from the pharmacy today and not take the cephalexin.  Return to the clinic in 2 weeks and eventually we will set him up with an appointment with the pedorthist.  Helane Gunther DPM

## 2018-05-09 ENCOUNTER — Ambulatory Visit: Payer: BLUE CROSS/BLUE SHIELD | Admitting: Podiatry

## 2018-05-13 DIAGNOSIS — F4320 Adjustment disorder, unspecified: Secondary | ICD-10-CM | POA: Diagnosis not present

## 2018-05-14 ENCOUNTER — Ambulatory Visit: Payer: BLUE CROSS/BLUE SHIELD | Admitting: Podiatry

## 2018-05-14 ENCOUNTER — Encounter: Payer: Self-pay | Admitting: Podiatry

## 2018-05-14 DIAGNOSIS — L02611 Cutaneous abscess of right foot: Secondary | ICD-10-CM

## 2018-05-14 DIAGNOSIS — L97511 Non-pressure chronic ulcer of other part of right foot limited to breakdown of skin: Secondary | ICD-10-CM

## 2018-05-14 DIAGNOSIS — L03031 Cellulitis of right toe: Secondary | ICD-10-CM

## 2018-05-14 DIAGNOSIS — E1142 Type 2 diabetes mellitus with diabetic polyneuropathy: Secondary | ICD-10-CM

## 2018-05-14 MED ORDER — CEPHALEXIN 500 MG PO CAPS: ORAL_CAPSULE | ORAL | 0 refills | Status: DC

## 2018-05-14 NOTE — Progress Notes (Signed)
This patient presents the office for continued evaluation and treatment of an infected diabetic ulcer right hallux.  He was treated myself following his original visit to the [facility] emergency care where he was treated with antibiotics.  He presented to my office on February 22 for continued evaluation and treatment of his infected diabetic ulcer right hallux.  Debridement of the ulcer was performed and diabetic insoles were dispensed to this patient to wear in his shoes.  He was told to continue to take his doxycycline but discontinue taking his cephalexin.  He presents the office today wearing no bandage on his right big toe.  He says he has intermittently soaked and bandaged his foot.  He states that he is approximately 300 pounds and from his previous visit I learned his blood sugar was excessive.  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  He also states he is developed a new skin lesion at the base of the third toe right foot.  He presents the office today for continued evaluation and treatment of this infected diabetic ulcer right big toe.    Vascular  Dorsalis pedis and posterior tibial pulses are palpable  B/L.  Capillary return  WNL.  Temperature gradient is  WNL.  Skin turgor  WNL  Sensorium  Senn Weinstein monofilament wire absent in toes but present in feet.  . Normal tactile sensation.  Nail Exam  Patient has normal nails with no evidence of bacterial or fungal infection.  Orthopedic  Exam  Muscle tone and muscle strength  WNL.  No limitations of motion feet  B/L.  No crepitus or joint effusion noted.  Foot type is unremarkable and digits show no abnormalities.  Hallux limitus  1st MPJ  B/L.  Skin   Normal skin texture and turgor.  Pinch callus left hallux.  Skin necrosis noted at the base of the third toe laterally right foot.  Diabetic ulcer measures 5 mm x 15 mm after debridement of excessive callus tissue.  There is persistent redness on the dorsum of the  toe but no evidence of swelling nor streaking.    Diabetic ulcer right hallux.  Diabetes with neuropathy  ROV.  Debridement of necrotic tissue performed right hallux.  The ulcer now measures 5 mm x 15 mm.  No evidence of any drainage or pus infection noted consistent with being on antibiotics.  Neosporin dry sterile dressing was applied to his right hallux.  Discussed this condition with this patient.  Told him that with him weighing 300 pounds and standing at work that the ulcer could possibly worsen.  Therefore I recommended he be given a week off from work to rest his foot and allow his foot to heal the ulcer.  He was told to soak his foot and bandage his foot.  Prescribed cephalexin with instructions to take 1 3 times daily.    This patient has been very noncompliant but I believe he will start taking better care of his diabetic ulcer since it has worsened in the 2-week span from his initial visit. Call the office as needed.  To consider an evaluation by pedorthist.   Helane Gunther DPM

## 2018-05-21 ENCOUNTER — Encounter: Payer: Self-pay | Admitting: Podiatry

## 2018-05-21 ENCOUNTER — Other Ambulatory Visit: Payer: Self-pay

## 2018-05-21 ENCOUNTER — Ambulatory Visit: Payer: BLUE CROSS/BLUE SHIELD | Admitting: Podiatry

## 2018-05-21 DIAGNOSIS — E1142 Type 2 diabetes mellitus with diabetic polyneuropathy: Secondary | ICD-10-CM | POA: Diagnosis not present

## 2018-05-21 DIAGNOSIS — E113393 Type 2 diabetes mellitus with moderate nonproliferative diabetic retinopathy without macular edema, bilateral: Secondary | ICD-10-CM | POA: Diagnosis not present

## 2018-05-21 DIAGNOSIS — L97511 Non-pressure chronic ulcer of other part of right foot limited to breakdown of skin: Secondary | ICD-10-CM | POA: Diagnosis not present

## 2018-05-21 MED ORDER — CEPHALEXIN 500 MG PO CAPS: ORAL_CAPSULE | ORAL | 0 refills | Status: DC

## 2018-05-21 NOTE — Progress Notes (Signed)
This patient presents to the office for continued evaluation and treatment of diabetic ulcer right hallux.  Patient says his ulcer is still present.  He has purchased and worn an orthowedge shoes as well as soaking and bandaging his toe.  He says he was unable to take time off from work to rest his foot.  He says he has finished his last course of cephalexin.  He presents to the office for continued evaluation and treatment.  Vascular  Dorsalis pedis and posterior tibial pulses are palpable  B/L.  Capillary return  WNL.  Temperature gradient is  WNL.  Skin turgor  WNL  Sensorium  Senn Weinstein monofilament wire  WNL on feet but absent on toes. Normal tactile sensation.  Nail Exam  Patient has normal nails with no evidence of bacterial or fungal infection.  Orthopedic  Exam  Muscle tone and muscle strength  WNL.  No limitations of motion feet  B/L.  No crepitus or joint effusion noted.  Foot type is unremarkable and digits show no abnormalities.  Hallux limitus 1st MPJ  B/L.  Skin  No open lesions.  Normal skin texture and turgor. Diabetic ulcer on right hallux measures 3 mm. X 10 mm.  Clean granulation tissue at site of ulcer.  Dry healing skin around  the ulcer.  No infection or drainage is present.  Healing diabetic ulcer.    Told to continue soaks, bandaging and wearing orthowedge shoe.  Prescribe cephalexin refill.  RTC 2 weeks.  Ulcer has improved compared to last visit.   Helane Gunther DPM

## 2018-06-04 ENCOUNTER — Encounter: Payer: Self-pay | Admitting: Podiatry

## 2018-06-04 ENCOUNTER — Ambulatory Visit: Payer: BLUE CROSS/BLUE SHIELD | Admitting: Podiatry

## 2018-06-04 ENCOUNTER — Other Ambulatory Visit: Payer: Self-pay

## 2018-06-04 DIAGNOSIS — L97511 Non-pressure chronic ulcer of other part of right foot limited to breakdown of skin: Secondary | ICD-10-CM | POA: Diagnosis not present

## 2018-06-04 DIAGNOSIS — E1142 Type 2 diabetes mellitus with diabetic polyneuropathy: Secondary | ICD-10-CM

## 2018-06-04 NOTE — Progress Notes (Signed)
This patient presents to the office for continued evaluation and treatment of diabetic ulcer right hallux.  Patient says his ulcer is healing and there is no drainage.  Marland Kitchen  He has purchased and worn an orthowedge shoes as well as soaking and bandaging his toe. He does present to the office today wearing regular footgear.    He presents to the office for continued evaluation and treatment.  Vascular  Dorsalis pedis and posterior tibial pulses are palpable  B/L.  Capillary return  WNL.  Temperature gradient is  WNL.  Skin turgor  WNL  Sensorium  Senn Weinstein monofilament wire  WNL on feet but absent on toes. Normal tactile sensation.  Nail Exam  Patient has normal nails with no evidence of bacterial or fungal infection.  Orthopedic  Exam  Muscle tone and muscle strength  WNL.  No limitations of motion feet  B/L.  No crepitus or joint effusion noted.  Foot type is unremarkable and digits show no abnormalities.  Hallux limitus 1st MPJ  B/L.  Skin  No open lesions.   Diabetic ulcer on right hallux  Has closed and is covered by callus over previous ulcer site.    Dry healing skin around  the closed  Ulcer site..  No infection or drainage is present.  Healing diabetic ulcer.    Told to continue wearing boot on his foot at work.  Told to limit work.  Told to moisturize his foot which appears dried out due to epsom salt soaks.    Ulcer has improved compared to last visit.  Told to monitor the ulcer site for reopening of skin lesion.     Helane Gunther DPM

## 2018-06-16 DIAGNOSIS — E1165 Type 2 diabetes mellitus with hyperglycemia: Secondary | ICD-10-CM | POA: Diagnosis not present

## 2018-06-16 DIAGNOSIS — E039 Hypothyroidism, unspecified: Secondary | ICD-10-CM | POA: Diagnosis not present

## 2018-06-16 DIAGNOSIS — E1140 Type 2 diabetes mellitus with diabetic neuropathy, unspecified: Secondary | ICD-10-CM | POA: Diagnosis not present

## 2018-06-23 ENCOUNTER — Encounter: Payer: Self-pay | Admitting: Podiatry

## 2018-06-23 ENCOUNTER — Other Ambulatory Visit: Payer: Self-pay

## 2018-06-23 ENCOUNTER — Ambulatory Visit: Payer: BLUE CROSS/BLUE SHIELD | Admitting: Podiatry

## 2018-06-23 ENCOUNTER — Ambulatory Visit (INDEPENDENT_AMBULATORY_CARE_PROVIDER_SITE_OTHER): Payer: BLUE CROSS/BLUE SHIELD

## 2018-06-23 VITALS — Temp 97.2°F

## 2018-06-23 DIAGNOSIS — E1142 Type 2 diabetes mellitus with diabetic polyneuropathy: Secondary | ICD-10-CM | POA: Diagnosis not present

## 2018-06-23 DIAGNOSIS — L97511 Non-pressure chronic ulcer of other part of right foot limited to breakdown of skin: Secondary | ICD-10-CM

## 2018-06-23 DIAGNOSIS — L84 Corns and callosities: Secondary | ICD-10-CM | POA: Diagnosis not present

## 2018-06-23 MED ORDER — CEPHALEXIN 500 MG PO CAPS: 500.0000 mg | ORAL_CAPSULE | Freq: Three times a day (TID) | ORAL | 0 refills | Status: DC

## 2018-06-23 MED ORDER — MUPIROCIN 2 % EX OINT: 1.0000 "application " | TOPICAL_OINTMENT | Freq: Two times a day (BID) | CUTANEOUS | 0 refills | Status: DC

## 2018-06-23 NOTE — Progress Notes (Signed)
Subjective: 43 year old male presents the office today for concerns of thick calluses and a possible wound on the right big toe.  This is been a chronic issue and has been under the care of Dr. Stacie Acres for this.  Per the last notes the wound did heal this was to keep moisturizer on the area today which he has been doing.  However when is on his feet more he gets more callus and the skin breaks down.  Also he was seen his doctor who recently just took him off of xigduo because they thought this was causing wounds to form.  He denies any drainage or pus coming from the area but he has noted some mild swelling of the right foot on the big toe. Denies any systemic complaints such as fevers, chills, nausea, vomiting. No acute changes since last appointment, and no other complaints at this time.   Objective: AAO x3, NAD DP/PT pulses palpable bilaterally, CRT less than 3 seconds Sensation decreased with Semmes Weinstein monofilament. Hyperkeratotic lesions are present of bilateral hallux IPJ's.  Upon debridement of the left side there is no ongoing ulceration there is pre-ulcerative.  On the right foot upon debridement there is a superficial skin fissure present measuring proximally 1.2 cm in length.  There is no probing, undermining or tunneling.  There is minimal edema to the toe but there is no significant erythema there is no ascending cellulitis.  There is no drainage or pus identified today. No open lesions or pre-ulcerative lesions.  No pain with calf compression, swelling, warmth, erythema  Assessment: Recurrent wound right hallux  Plan: -All treatment options discussed with the patient including all alternatives, risks, complications.  -X-rays were obtained and reviewed with the patient. No evidence of acute fracture or osteomyelitis.  -I debrided bilateral hyperkeratotic lesions as well as the right hallux wound without any complications or bleeding.  Recommended antibiotic ointment dressing  changes daily as well as offloading.  He had a OrthoWedge shoe at home but is worn out.  I offered to give him 1 today in the office but he declined these and order one online.  Ultimately we need to do so and help take pressure off the area I do think a custom orthotic to help offload the big toes will be helpful.  Raiford Noble saw him today he was molded for orthotics. -Keflex due to the mild swelling -Monitor for any clinical signs or symptoms of infection and directed to call the office immediately should any occur or go to the ER. -Patient encouraged to call the office with any questions, concerns, change in symptoms.   Vivi Barrack DPM

## 2018-06-23 NOTE — Patient Instructions (Addendum)
Apply antibiotic ointment on the right big toe twice a day. Start the oral antibiotics as well.  If there is any increase in redness, swelling, pain, drainage callus immediately or go to the ER  If was nice to meet you today. If you have any questions or any further concerns, please feel fee to give me a call. You can call our office at [phone] or please feel fee to send me a message through MyChart.

## 2018-06-26 DIAGNOSIS — F909 Attention-deficit hyperactivity disorder, unspecified type: Secondary | ICD-10-CM | POA: Diagnosis not present

## 2018-06-26 DIAGNOSIS — E1165 Type 2 diabetes mellitus with hyperglycemia: Secondary | ICD-10-CM | POA: Diagnosis not present

## 2018-06-26 DIAGNOSIS — N529 Male erectile dysfunction, unspecified: Secondary | ICD-10-CM | POA: Diagnosis not present

## 2018-06-26 DIAGNOSIS — E039 Hypothyroidism, unspecified: Secondary | ICD-10-CM | POA: Diagnosis not present

## 2018-07-01 ENCOUNTER — Encounter: Payer: Self-pay | Admitting: Podiatry

## 2018-07-01 ENCOUNTER — Ambulatory Visit: Payer: BLUE CROSS/BLUE SHIELD | Admitting: Podiatry

## 2018-07-01 ENCOUNTER — Other Ambulatory Visit: Payer: Self-pay

## 2018-07-01 VITALS — Temp 97.3°F

## 2018-07-01 DIAGNOSIS — E1142 Type 2 diabetes mellitus with diabetic polyneuropathy: Secondary | ICD-10-CM | POA: Diagnosis not present

## 2018-07-01 DIAGNOSIS — L97511 Non-pressure chronic ulcer of other part of right foot limited to breakdown of skin: Secondary | ICD-10-CM

## 2018-07-07 NOTE — Progress Notes (Signed)
Subjective: 43 year old male presents the office today for evaluation of a wound on the right big toe.  He states that he is taken a week off of work.  He states his been staff is certainly thinks that area is doing better.  He denies any drainage or pus coming from the area denies any increase in redness or swelling. Currently denies any systemic complaints such as fevers, chills, nausea, vomiting.  He did have some respiratory symptoms and apparently he was checked for coronavirus which was negative.  Objective: AAO x3, NAD DP/PT pulses palpable bilaterally, CRT less than 3 seconds Upon debridement of the hyperkeratotic lesion on the right foot hallux IPJ there is still a superficial skin fissure, ulceration present although it seems smaller today.  Today it measures approximate 0.8 x 0.2 cm.  There is no probing, undermining or tunneling.  There is minimal surrounding erythema but compared to contralateral extremity. The same.  There is no fluctuation crepitation any malodor. No open lesions or pre-ulcerative lesions.  No pain with calf compression, swelling, warmth, erythema  Assessment: Ulceration right foot  Plan: -All treatment options discussed with the patient including all alternatives, risks, complications.  -I sharply debrided the hyperkeratotic lesion debrided the skin fissure, ulcer down to healthy, granular tissue was #312 with scalpel.  Continue daily dressing changes.  Finish course of Keflex.  Continue medical dressing changes.  Offloading at all times.  Awaiting orthotics. -Monitor for any clinical signs or symptoms of infection and directed to call the office immediately should any occur or go to the ER.  Return in about 2 weeks (around 07/15/2018).   -Patient encouraged to call the office with any questions, concerns, change in symptoms.

## 2018-07-14 ENCOUNTER — Ambulatory Visit: Payer: BLUE CROSS/BLUE SHIELD | Admitting: Podiatry

## 2018-07-14 ENCOUNTER — Other Ambulatory Visit: Payer: Self-pay

## 2018-07-14 ENCOUNTER — Encounter: Payer: Self-pay | Admitting: Podiatry

## 2018-07-14 ENCOUNTER — Ambulatory Visit: Payer: BLUE CROSS/BLUE SHIELD | Admitting: Orthotics

## 2018-07-14 DIAGNOSIS — L03031 Cellulitis of right toe: Secondary | ICD-10-CM

## 2018-07-14 DIAGNOSIS — L84 Corns and callosities: Secondary | ICD-10-CM

## 2018-07-14 DIAGNOSIS — E1142 Type 2 diabetes mellitus with diabetic polyneuropathy: Secondary | ICD-10-CM

## 2018-07-14 DIAGNOSIS — L97511 Non-pressure chronic ulcer of other part of right foot limited to breakdown of skin: Secondary | ICD-10-CM | POA: Diagnosis not present

## 2018-07-14 DIAGNOSIS — L02611 Cutaneous abscess of right foot: Secondary | ICD-10-CM | POA: Diagnosis not present

## 2018-07-14 MED ORDER — CLINDAMYCIN HCL 300 MG PO CAPS: 300.0000 mg | ORAL_CAPSULE | Freq: Three times a day (TID) | ORAL | 2 refills | Status: DC

## 2018-07-14 MED ORDER — CIPROFLOXACIN HCL 500 MG PO TABS: 500.0000 mg | ORAL_TABLET | Freq: Two times a day (BID) | ORAL | 0 refills | Status: DC

## 2018-07-14 NOTE — Progress Notes (Signed)
Patient came in today to pick up custom made foot orthotics.  The goals were accomplished and the patient reported no dissatisfaction with said orthotics.  Patient was advised of breakin period and how to report any issues. 

## 2018-07-15 LAB — CBC WITH DIFFERENTIAL/PLATELET
Absolute Monocytes: 768 cells/uL (ref 200–950)
Basophils Absolute: 115 cells/uL (ref 0–200)
Basophils Relative: 0.9 %
Eosinophils Absolute: 269 cells/uL (ref 15–500)
Eosinophils Relative: 2.1 %
HCT: 45.6 % (ref 38.5–50.0)
Hemoglobin: 15.4 g/dL (ref 13.2–17.1)
Lymphs Abs: 3008 cells/uL (ref 850–3900)
MCH: 27.5 pg (ref 27.0–33.0)
MCHC: 33.8 g/dL (ref 32.0–36.0)
MCV: 81.3 fL (ref 80.0–100.0)
MPV: 11.3 fL (ref 7.5–12.5)
Monocytes Relative: 6 %
Neutro Abs: 8640 cells/uL — ABNORMAL HIGH (ref 1500–7800)
Neutrophils Relative %: 67.5 %
Platelets: 308 10*3/uL (ref 140–400)
RBC: 5.61 10*6/uL (ref 4.20–5.80)
RDW: 13.6 % (ref 11.0–15.0)
Total Lymphocyte: 23.5 %
WBC: 12.8 10*3/uL — ABNORMAL HIGH (ref 3.8–10.8)

## 2018-07-15 LAB — BASIC METABOLIC PANEL
BUN: 18 mg/dL (ref 7–25)
CO2: 26 mmol/L (ref 20–32)
Calcium: 9.3 mg/dL (ref 8.6–10.3)
Chloride: 100 mmol/L (ref 98–110)
Creat: 1.02 mg/dL (ref 0.60–1.35)
Glucose, Bld: 269 mg/dL — ABNORMAL HIGH (ref 65–99)
Potassium: 4.1 mmol/L (ref 3.5–5.3)
Sodium: 136 mmol/L (ref 135–146)

## 2018-07-15 LAB — C-REACTIVE PROTEIN: CRP: 47.8 mg/L — ABNORMAL HIGH (ref <8.0)

## 2018-07-15 LAB — SEDIMENTATION RATE: Sed Rate: 19 mm/h — ABNORMAL HIGH (ref 0–15)

## 2018-07-16 ENCOUNTER — Telehealth: Payer: Self-pay | Admitting: *Deleted

## 2018-07-16 DIAGNOSIS — E1142 Type 2 diabetes mellitus with diabetic polyneuropathy: Secondary | ICD-10-CM

## 2018-07-16 DIAGNOSIS — L97511 Non-pressure chronic ulcer of other part of right foot limited to breakdown of skin: Secondary | ICD-10-CM

## 2018-07-16 NOTE — Telephone Encounter (Signed)
Mailed copy of blood work with handwritten note stating "Reminder".

## 2018-07-16 NOTE — Telephone Encounter (Signed)
-----  Message from Trula Slade, DPM sent at 07/16/2018  3:30 PM EDT ----- I called the patient on 07/15/2018 to go over blood work. He states he just started the new antibiotics on Tuesday morning. No changes in his toe from when I saw him. No increase in swelling, redness, drainage or pus.   Val- can you please order CBC, ESR, CRP to be repeated next week? He is aware we are going to do that. Thanks.

## 2018-07-17 LAB — WOUND CULTURE
MICRO NUMBER:: 442630
SPECIMEN QUALITY:: ADEQUATE

## 2018-07-22 ENCOUNTER — Other Ambulatory Visit: Payer: Self-pay

## 2018-07-22 ENCOUNTER — Ambulatory Visit: Payer: BLUE CROSS/BLUE SHIELD | Admitting: Podiatry

## 2018-07-22 ENCOUNTER — Ambulatory Visit (INDEPENDENT_AMBULATORY_CARE_PROVIDER_SITE_OTHER): Payer: BLUE CROSS/BLUE SHIELD

## 2018-07-22 DIAGNOSIS — L02611 Cutaneous abscess of right foot: Secondary | ICD-10-CM | POA: Diagnosis not present

## 2018-07-22 DIAGNOSIS — E1142 Type 2 diabetes mellitus with diabetic polyneuropathy: Secondary | ICD-10-CM | POA: Diagnosis not present

## 2018-07-22 DIAGNOSIS — L97511 Non-pressure chronic ulcer of other part of right foot limited to breakdown of skin: Secondary | ICD-10-CM

## 2018-07-22 DIAGNOSIS — L03031 Cellulitis of right toe: Secondary | ICD-10-CM

## 2018-07-22 MED ORDER — CEPHALEXIN 500 MG PO CAPS: 500.0000 mg | ORAL_CAPSULE | Freq: Three times a day (TID) | ORAL | 0 refills | Status: DC

## 2018-07-22 NOTE — Progress Notes (Signed)
Subjective: 43 year old male presents the office today for follow-up evaluation of an ulcer on the right foot.  He states that that is doing about the same.  Finished antibiotics.  He states that today he noticed that the toe has been little more swollen and red and he thinks this may be aggravating his been on his feet a lot at work to a lot of walking.  He states that his blood sugars chronically been high and runs in the 300s.  He does follow with an endocrinologist.  Denies any drainage or pus or any red streaks. Denies any systemic complaints such as fevers, chills, nausea, vomiting. No acute changes since last appointment, and no other complaints at this time.   Objective: AAO x3, NAD DP/PT pulses palpable bilaterally, CRT less than 3 seconds Hyperkeratotic lesion was present to the medial aspect the right hallux IPJ.  Upon debridement there is a wound today measuring 1 x 0.3 x 0.5 cm with a granular wound base after debridement.  Prior to debridement the wound was 0.6 x 0.1 x 0.2 cm and had more hyperkeratotic tissue overlying the area.  There is localized edema and faint erythema to the toe which is worse than last appointment.  No fluctuation or crepitation.  There is no malodor.  No ascending cellulitis. No open lesions or pre-ulcerative lesions.  No pain with calf compression, swelling, warmth, erythema  Assessment: Ulceration right foot with localized edema and erythema  Plan: -All treatment options discussed with the patient including all alternatives, risks, complications.  -Patient debrided the wound utilizing #312 blade scalpel to debride all nonviable soft tissue down to healthy, bleeding, granular tissue.  Continue offloading at all times.  Prescribed clindamycin and ciprofloxacin.  Wound culture was obtained today.  Also ordered CBC, ESR, CRP. -Monitoring signs or symptoms of worsening infection must report to the emergency room.  Return in about 1 week (around  07/21/2018).  Trula Slade DPM  -Patient encouraged to call the office with any questions, concerns, change in symptoms.

## 2018-07-25 NOTE — Progress Notes (Signed)
Subjective: 43 year old male presents the office today for follow-up evaluation of an ulcer on the right foot.  Overall he thinks the wound is healing he thinks the wound is closed.  Is on antibiotics.  He denies any drainage or pus.  He has some swelling still but overall the swelling is improved.  Still gets red at times.  No red streaks.  Overall he feels well denies any fevers, chills, nausea, vomiting.  No calf pain, chest pain, shortness of breath.    Objective: AAO x3, NAD DP/PT pulses palpable bilaterally, CRT less than 3 seconds Hyperkeratotic lesion was present to the medial aspect the right hallux IPJ.  It appears that the wound has most healed measuring 0.5 x 0.2 x 0.1 cm.however after debridement of the callus to reveal underlying ulceration which measures 0.8 x 0.2 x 0.4 cm.  There is mild improvement in the wound.  There is decreased edema to the toe and there is decreased erythema but does remain.  There is no ascending cellulitis.  There is no fluctuation crepitation any malodor. No open lesions or pre-ulcerative lesions.  No pain with calf compression, swelling, warmth, erythema  Assessment: Ulceration right foot with localized edema and erythema, improving  Plan: -All treatment options discussed with the patient including all alternatives, risks, complications.  -X-rays were obtained and reviewed.  No definitive cortical changes suggestive of osteomyelitis there is no soft tissue emphysema identified today. -I sharply debrided the wound utilizing a 312 blade scalpel down to healthy, bleeding, granular tissue any complications.  Continue mupirocin ointment dressing changes daily.  Continue antibiotics.  Refilled today.  We will go back to Keflex.  Wound culture reviewed. -Continue offloading at all times.  Further offloaded issue today. -He is can get the repeat blood work on this week. -He is on his feet quite a bit.  We discussed trying the other this foot in order to help his  heel to help prevent any spreading of infection or irritation.  He understands and he has high risk of amputation.  Return in about 1 week (around 07/29/2018).  Vivi Barrack DPM

## 2018-07-29 ENCOUNTER — Ambulatory Visit: Payer: BLUE CROSS/BLUE SHIELD | Admitting: Podiatry

## 2018-07-29 ENCOUNTER — Other Ambulatory Visit: Payer: Self-pay

## 2018-07-29 DIAGNOSIS — L97511 Non-pressure chronic ulcer of other part of right foot limited to breakdown of skin: Secondary | ICD-10-CM

## 2018-07-29 DIAGNOSIS — E1142 Type 2 diabetes mellitus with diabetic polyneuropathy: Secondary | ICD-10-CM

## 2018-07-29 NOTE — Patient Instructions (Signed)
Keep the antibiotic ointment on the wound daily Monitor for any signs/symptoms of infection. Call the office immediately if any occur or go directly to the emergency room. Call with any questions/concerns.

## 2018-08-07 NOTE — Progress Notes (Signed)
Subjective: 43 year old male presents the office today for follow-up evaluation of an ulcer on the right foot.  He states that he thinks I will be pleased that the wound is doing well.  Feels the area is almost healed.  He denies any drainage or pus or any swelling and redness is improved.  He has no other concerns today. Overall he feels well denies any fevers, chills, nausea, vomiting.  No calf pain, chest pain, shortness of breath.    Objective: AAO x3, NAD DP/PT pulses palpable bilaterally, CRT less than 3 seconds Hyperkeratotic lesion was present to the medial aspect the right hallux IPJ.  Prior to debridement of the hyperkeratotic lesion appears that the wound is healed however after debridement the wound still measures approximate 0.5 x 0.2 x 0.3 cm.  Overall is getting better but still present.  There is no probing to bone, undermining or tunneling.  There is decreased edema and erythema to the area there is no increase in warmth.  No fluctuation crepitation malodor. No open lesions or pre-ulcerative lesions.  No pain with calf compression, swelling, warmth, erythema  Assessment: Ulceration right foot, improving  Plan: -All treatment options discussed with the patient including all alternatives, risks, complications.  -I debrided the wound was 312 with scalpel without any complications or bleeding.  See measurements above. -Continue offloading at all times.  Continue daily dressing changes with antibiotic ointment. -Discussed to get his blood work rechecked, clinically is improving. -Monitor for any clinical signs or symptoms of infection and directed to call the office immediately should any occur or go to the ER.  Vivi Barrack DPM

## 2018-08-12 ENCOUNTER — Other Ambulatory Visit: Payer: Self-pay

## 2018-08-12 ENCOUNTER — Ambulatory Visit: Payer: BLUE CROSS/BLUE SHIELD | Admitting: Podiatry

## 2018-08-12 ENCOUNTER — Encounter: Payer: Self-pay | Admitting: Podiatry

## 2018-08-12 VITALS — Temp 97.3°F

## 2018-08-12 DIAGNOSIS — E1142 Type 2 diabetes mellitus with diabetic polyneuropathy: Secondary | ICD-10-CM

## 2018-08-12 DIAGNOSIS — L97511 Non-pressure chronic ulcer of other part of right foot limited to breakdown of skin: Secondary | ICD-10-CM

## 2018-08-21 NOTE — Progress Notes (Signed)
Subjective: 43 year old male presents the office today for follow-up evaluation of an ulcer on the right foot.  Overall this is doing well.  He states that there is been no drainage, redness or any swelling.  He does use a file tries to trim the callus himself.  He has no new concerns. Overall he feels well denies any fevers, chills, nausea, vomiting.  No calf pain, chest pain, shortness of breath.    Objective: AAO x3, NAD DP/PT pulses palpable bilaterally, CRT less than 3 seconds Hyperkeratotic lesion was present to the medial aspect the right hallux IPJ.  Upon debridement there is a small superficial wound present measuring 0.3 x 0.2 x 0.2 cm.  Overall the wound is improved.  There is no probing to bone, undermining or tunneling.  No edema, erythema, drainage or pus or any clinical signs of infection.  No open lesions or pre-ulcerative lesions.  No pain with calf compression, swelling, warmth, erythema  Assessment: Ulceration right foot, improving  Plan: -All treatment options discussed with the patient including all alternatives, risks, complications.  -I debrided the wound was 312 with scalpel without any complications or bleeding.  See measurements above. -Continue offloading at all times.  Continue daily dressing changes with antibiotic ointment. -Encouraged him not to try to trim the callus himself -Discussed to get his blood work rechecked, clinically is improving. -Monitor for any clinical signs or symptoms of infection and directed to call the office immediately should any occur or go to the ER.  Trula Slade DPM

## 2018-08-26 ENCOUNTER — Ambulatory Visit: Payer: BC Managed Care – PPO | Admitting: Podiatry

## 2018-09-09 ENCOUNTER — Other Ambulatory Visit: Payer: Self-pay

## 2018-09-09 ENCOUNTER — Ambulatory Visit: Payer: BC Managed Care – PPO | Admitting: Podiatry

## 2018-09-09 DIAGNOSIS — L97511 Non-pressure chronic ulcer of other part of right foot limited to breakdown of skin: Secondary | ICD-10-CM

## 2018-09-09 DIAGNOSIS — E1142 Type 2 diabetes mellitus with diabetic polyneuropathy: Secondary | ICD-10-CM | POA: Diagnosis not present

## 2018-09-15 DIAGNOSIS — E1165 Type 2 diabetes mellitus with hyperglycemia: Secondary | ICD-10-CM | POA: Diagnosis not present

## 2018-09-15 DIAGNOSIS — E039 Hypothyroidism, unspecified: Secondary | ICD-10-CM | POA: Diagnosis not present

## 2018-09-15 DIAGNOSIS — E1140 Type 2 diabetes mellitus with diabetic neuropathy, unspecified: Secondary | ICD-10-CM | POA: Diagnosis not present

## 2018-09-22 NOTE — Progress Notes (Signed)
Subjective: 43 year old male presents the office today for follow-up evaluation of an ulcer on the right foot.  Overall he states that the toe is doing well in regard to there is no redness or warmth.  There is no drainage or pus.  He still has a scab along the toe which he has been trying to trim himself as he has previously. Denies any fevers, chills, nausea, vomiting.  No calf pain, chest pain, shortness of breath.    Objective: AAO x3, NAD DP/PT pulses palpable bilaterally, CRT less than 3 seconds Hyperkeratotic lesion was present to the medial aspect the right hallux IPJ.  Upon debridement there is still a small superficial wound bed of pinpoint size.  There is no probing, undermining or tunneling.  There is no surrounding erythema, ascending cellulitis.  There is no fluctuation of the tissue or malodor. No open lesions or pre-ulcerative lesions.  No pain with calf compression, swelling, warmth, erythema  Assessment: Ulceration right foot, without signs of infection  Plan: -All treatment options discussed with the patient including all alternatives, risks, complications.  -I debrided the wound was 312 with scalpel without any complications or bleeding.  See measurements above. -Continue offloading at all times.  Continue daily dressing changes with antibiotic ointment. -He did not get the remainder of the blood work done. -Continue to encourage him not to try to trim the callus himself -Monitor for any clinical signs or symptoms of infection and directed to call the office immediately should any occur or go to the ER.  Trula Slade DPM

## 2018-09-30 ENCOUNTER — Ambulatory Visit: Payer: BC Managed Care – PPO | Admitting: Podiatry

## 2018-11-26 DIAGNOSIS — E669 Obesity, unspecified: Secondary | ICD-10-CM | POA: Diagnosis not present

## 2018-11-26 DIAGNOSIS — F909 Attention-deficit hyperactivity disorder, unspecified type: Secondary | ICD-10-CM | POA: Diagnosis not present

## 2018-11-26 DIAGNOSIS — E039 Hypothyroidism, unspecified: Secondary | ICD-10-CM | POA: Diagnosis not present

## 2018-11-26 DIAGNOSIS — Z23 Encounter for immunization: Secondary | ICD-10-CM | POA: Diagnosis not present

## 2018-11-26 DIAGNOSIS — N529 Male erectile dysfunction, unspecified: Secondary | ICD-10-CM | POA: Diagnosis not present

## 2019-01-06 DIAGNOSIS — E291 Testicular hypofunction: Secondary | ICD-10-CM | POA: Diagnosis not present

## 2019-01-19 DIAGNOSIS — E039 Hypothyroidism, unspecified: Secondary | ICD-10-CM | POA: Diagnosis not present

## 2019-01-19 DIAGNOSIS — E1165 Type 2 diabetes mellitus with hyperglycemia: Secondary | ICD-10-CM | POA: Diagnosis not present

## 2019-01-19 DIAGNOSIS — E1140 Type 2 diabetes mellitus with diabetic neuropathy, unspecified: Secondary | ICD-10-CM | POA: Diagnosis not present

## 2019-01-21 DIAGNOSIS — E291 Testicular hypofunction: Secondary | ICD-10-CM | POA: Diagnosis not present

## 2019-02-20 DIAGNOSIS — F329 Major depressive disorder, single episode, unspecified: Secondary | ICD-10-CM | POA: Diagnosis not present

## 2019-03-17 ENCOUNTER — Encounter: Payer: Self-pay | Admitting: Podiatry

## 2019-03-17 ENCOUNTER — Ambulatory Visit: Payer: BLUE CROSS/BLUE SHIELD | Admitting: Podiatry

## 2019-03-17 ENCOUNTER — Other Ambulatory Visit: Payer: Self-pay

## 2019-03-17 VITALS — Temp 97.7°F

## 2019-03-17 DIAGNOSIS — L97501 Non-pressure chronic ulcer of other part of unspecified foot limited to breakdown of skin: Secondary | ICD-10-CM | POA: Diagnosis not present

## 2019-03-17 DIAGNOSIS — L84 Corns and callosities: Secondary | ICD-10-CM

## 2019-03-17 DIAGNOSIS — L97511 Non-pressure chronic ulcer of other part of right foot limited to breakdown of skin: Secondary | ICD-10-CM

## 2019-03-17 DIAGNOSIS — E1142 Type 2 diabetes mellitus with diabetic polyneuropathy: Secondary | ICD-10-CM | POA: Diagnosis not present

## 2019-03-17 MED ORDER — CEPHALEXIN 500 MG PO CAPS: 500.0000 mg | ORAL_CAPSULE | Freq: Three times a day (TID) | ORAL | 0 refills | Status: DC

## 2019-03-19 NOTE — Progress Notes (Signed)
Subjective: 44 year old male presents the office today for concerns of a painful callus on the side of his right big toe.  He also notes some spots on the top of his left foot but he states these are doing much better.  These are healing nicely and his main concern is the right big toe.  Denies any drainage or pus or swelling.  Denies any systemic complaints such as fevers, chills, nausea, vomiting. No acute changes since last appointment, and no other complaints at this time.   Objective: AAO x3, NAD DP/PT pulses palpable bilaterally, CRT less than 3 seconds Sensation decreased with Semmes Weinstein monofilament.  On the medial aspect the right hallux is a hyperkeratotic tissue with dried blood.  Upon debridement the area is preulcerative but there is no drainage or pus.  Minimal edema.  There is no erythema or warmth.  There is no fluctuation crepitation.  No malodor.  On the dorsal aspect of the left foot there appears to be superficial areas of skin breakdown but scabs are starting to form.  No drainage or pus. No open lesions or pre-ulcerative lesions.  No pain with calf compression, swelling, warmth, erythema        Assessment: Preulcerative callus right hallux with healing wounds left dorsal foot  Plan: -All treatment options discussed with the patient including all alternatives, risks, complications.  -Debrided hyperkeratotic tissue on the right hallux without any complications or bleeding.  The area is preulcerative. -Recommend antibiotic ointment to the areas bilaterally.  Prescribed Keflex.  Offloading at all times.  Offloading pads dispensed. -Patient encouraged to call the office with any questions, concerns, change in symptoms.   Vivi Barrack DPM

## 2019-04-07 ENCOUNTER — Ambulatory Visit: Payer: BC Managed Care – PPO | Admitting: Podiatry

## 2019-04-07 ENCOUNTER — Encounter: Payer: Self-pay | Admitting: Podiatry

## 2019-04-07 ENCOUNTER — Other Ambulatory Visit: Payer: Self-pay

## 2019-04-07 DIAGNOSIS — R234 Changes in skin texture: Secondary | ICD-10-CM | POA: Diagnosis not present

## 2019-04-07 DIAGNOSIS — Z79899 Other long term (current) drug therapy: Secondary | ICD-10-CM

## 2019-04-07 DIAGNOSIS — B351 Tinea unguium: Secondary | ICD-10-CM

## 2019-04-07 DIAGNOSIS — L84 Corns and callosities: Secondary | ICD-10-CM | POA: Diagnosis not present

## 2019-04-07 NOTE — Patient Instructions (Signed)
Apply a small amount of antibiotic ointment on the toe wound/crack daily. Do not apply moisturizer to the open wound   Terbinafine oral granules What is this medicine? TERBINAFINE (TER bin a feen) is an antifungal medicine. It is used to treat certain kinds of fungal or yeast infections. This medicine may be used for other purposes; ask your health care provider or pharmacist if you have questions. COMMON BRAND NAME(S): Lamisil What should I tell my health care provider before I take this medicine? They need to know if you have any of these conditions:  drink alcoholic beverages  kidney disease  liver disease  an unusual or allergic reaction to Terbinafine, other medicines, foods, dyes, or preservatives  pregnant or trying to get pregnant  breast-feeding How should I use this medicine? Take this medicine by mouth. Follow the directions on the prescription label. Hold packet with cut line on top. Shake packet gently to settle contents. Tear packet open along cut line, or use scissors to cut across line. Carefully pour the entire contents of packet onto a spoonful of a soft food, such as pudding or other soft, non-acidic food such as mashed potatoes (do NOT use applesauce or a fruit-based food). If two packets are required for each dose, you may either sprinkle the content of both packets on one spoonful of non-acidic food, or sprinkle the contents of both packets on two spoonfuls of non-acidic food. Make sure that no granules remain in the packet. Swallow the mxiture of the food and granules without chewing. Take your medicine at regular intervals. Do not take it more often than directed. Take all of your medicine as directed even if you think you are better. Do not skip doses or stop your medicine early. Contact your pediatrician or health care professional regarding the use of this medicine in children. While this medicine may be prescribed for children as young as 4 years for selected  conditions, precautions do apply. Overdosage: If you think you have taken too much of this medicine contact a poison control center or emergency room at once. NOTE: This medicine is only for you. Do not share this medicine with others. What if I miss a dose? If you miss a dose, take it as soon as you can. If it is almost time for your next dose, take only that dose. Do not take double or extra doses. What may interact with this medicine? Do not take this medicine with any of the following medications:  thioridazine This medicine may also interact with the following medications:  beta-blockers  caffeine  cimetidine  cyclosporine  MAOIs like Carbex, Eldepryl, Marplan, Nardil, and Parnate  medicines for fungal infections like fluconazole and ketoconazole  medicines for irregular heartbeat like amiodarone, flecainide and propafenone  rifampin  SSRIs like citalopram, escitalopram, fluoxetine, fluvoxamine, paroxetine and sertraline  tricyclic antidepressants like amitriptyline, clomipramine, desipramine, imipramine, nortriptyline, and others  warfarin This list may not describe all possible interactions. Give your health care provider a list of all the medicines, herbs, non-prescription drugs, or dietary supplements you use. Also tell them if you smoke, drink alcohol, or use illegal drugs. Some items may interact with your medicine. What should I watch for while using this medicine? Your doctor may monitor your liver function. Tell your doctor right away if you have nausea or vomiting, loss of appetite, stomach pain on your right upper side, yellow skin, dark urine, light stools, or are over tired. This medicine may cause serious skin reactions. They can  happen weeks to months after starting the medicine. Contact your health care provider right away if you notice fevers or flu-like symptoms with a rash. The rash may be red or purple and then turn into blisters or peeling of the skin. Or,  you might notice a red rash with swelling of the face, lips or lymph nodes in your neck or under your arms. You need to take this medicine for 6 weeks or longer to cure the fungal infection. Take your medicine regularly for as long as your doctor or health care provider tells you to. What side effects may I notice from receiving this medicine? Side effects that you should report to your doctor or health care professional as soon as possible:  allergic reactions like skin rash or hives, swelling of the face, lips, or tongue  change in vision  dark urine  fever or infection  general ill feeling or flu-like symptoms  light-colored stools  loss of appetite, nausea  rash, fever, and swollen lymph nodes  redness, blistering, peeling or loosening of the skin, including inside the mouth  right upper belly pain  unusually weak or tired  yellowing of the eyes or skin Side effects that usually do not require medical attention (report to your doctor or health care professional if they continue or are bothersome):  changes in taste  diarrhea  hair loss  muscle or joint pain  stomach upset This list may not describe all possible side effects. Call your doctor for medical advice about side effects. You may report side effects to FDA at 1-800-FDA-1088. Where should I keep my medicine? Keep out of the reach of children. Store at room temperature between 15 and 30 degrees C (59 and 86 degrees F). Throw away any unused medicine after the expiration date. NOTE: This sheet is a summary. It may not cover all possible information. If you have questions about this medicine, talk to your doctor, pharmacist, or health care provider.  2020 Elsevier/Gold Standard (2018-06-06 15:35:11)

## 2019-04-07 NOTE — Progress Notes (Signed)
Subjective: 44 year old male presents the office today for concerns of a painful callus on the side of his right big toe.  He states that after return in the coming back.  He did bring his shoes and inserts in with him today.  Denies any open sores denies any redness or drainage and swelling.  He is also asking there is any treatment we can do for that toenail fungus.  He also becoming thickened discolored.  No pain to the nails no redness or drainage. Denies any systemic complaints such as fevers, chills, nausea, vomiting. No acute changes since last appointment, and no other complaints at this time.   Objective: AAO x3, NAD DP/PT pulses palpable bilaterally, CRT less than 3 seconds Sensation decreased with Semmes Weinstein monofilament.   Rash, wounds on the dorsal aspect left foot are healing well without any signs of infection.  Hyperkeratotic lesions bilateral hallux.  Dried blood on the hyperkeratotic lesion on the right side.  No dried blood in the left side.  There is skin fissures present just proximal to the calluses today with superficial granular wound present.  There is no drainage or pus there is no edema, erythema.  There is no probing, undermining or tunneling. Nails are hypertrophic, dystrophic with yellow-brown discoloration.  No pain the nails there is no redness or drainage or any signs of infection otherwise. No pain with calf compression, swelling, warmth, erythema  Assessment: Preulcerative callus right hallux with healing wounds left dorsal foot  Plan: -All treatment options discussed with the patient including all alternatives, risks, complications.  -Debrided hyperkeratotic tissue on the right hallux without any complications or bleeding.  There is new skin fissures present today which also debrided with a #312 with scalpel down to healthy tissue.  Recommended a small amount of antibiotic ointment dressing changes daily.  I had Raiford Noble evaluate him today to further modify the  inserts to see if he can take more pressure off the hallux.  Unfortunately this is coming from his gait pattern will need to work on controlling this.  Monitor for any signs or symptoms of infection. -Discussed treatment options for nail fungus.  Discussed Lamisil.  Discussed side effects and risks of this medication.  We will check a CBC and LFT.  Return in about 2 weeks (around 04/21/2019).  Vivi Barrack DPM

## 2019-04-09 DIAGNOSIS — F909 Attention-deficit hyperactivity disorder, unspecified type: Secondary | ICD-10-CM | POA: Diagnosis not present

## 2019-04-09 DIAGNOSIS — F329 Major depressive disorder, single episode, unspecified: Secondary | ICD-10-CM | POA: Diagnosis not present

## 2019-04-09 DIAGNOSIS — E11621 Type 2 diabetes mellitus with foot ulcer: Secondary | ICD-10-CM | POA: Diagnosis not present

## 2019-04-13 DIAGNOSIS — Z79899 Other long term (current) drug therapy: Secondary | ICD-10-CM | POA: Diagnosis not present

## 2019-04-13 LAB — HEPATIC FUNCTION PANEL
AG Ratio: 1.2 (calc) (ref 1.0–2.5)
ALT: 21 U/L (ref 9–46)
AST: 12 U/L (ref 10–40)
Albumin: 4.2 g/dL (ref 3.6–5.1)
Alkaline phosphatase (APISO): 91 U/L (ref 36–130)
Bilirubin, Direct: 0.1 mg/dL (ref 0.0–0.2)
Globulin: 3.5 g/dL (calc) (ref 1.9–3.7)
Indirect Bilirubin: 0.3 mg/dL (calc) (ref 0.2–1.2)
Total Bilirubin: 0.4 mg/dL (ref 0.2–1.2)
Total Protein: 7.7 g/dL (ref 6.1–8.1)

## 2019-04-13 LAB — CBC WITH DIFFERENTIAL/PLATELET
Absolute Monocytes: 675 cells/uL (ref 200–950)
Basophils Absolute: 138 cells/uL (ref 0–200)
Basophils Relative: 1.1 %
Eosinophils Absolute: 225 cells/uL (ref 15–500)
Eosinophils Relative: 1.8 %
HCT: 47.6 % (ref 38.5–50.0)
Hemoglobin: 16 g/dL (ref 13.2–17.1)
Lymphs Abs: 2763 cells/uL (ref 850–3900)
MCH: 27.4 pg (ref 27.0–33.0)
MCHC: 33.6 g/dL (ref 32.0–36.0)
MCV: 81.4 fL (ref 80.0–100.0)
MPV: 11.1 fL (ref 7.5–12.5)
Monocytes Relative: 5.4 %
Neutro Abs: 8700 cells/uL — ABNORMAL HIGH (ref 1500–7800)
Neutrophils Relative %: 69.6 %
Platelets: 298 10*3/uL (ref 140–400)
RBC: 5.85 10*6/uL — ABNORMAL HIGH (ref 4.20–5.80)
RDW: 13.2 % (ref 11.0–15.0)
Total Lymphocyte: 22.1 %
WBC: 12.5 10*3/uL — ABNORMAL HIGH (ref 3.8–10.8)

## 2019-04-14 ENCOUNTER — Other Ambulatory Visit: Payer: Self-pay | Admitting: Podiatry

## 2019-04-14 MED ORDER — AMOXICILLIN-POT CLAVULANATE 875-125 MG PO TABS: 1.0000 | ORAL_TABLET | Freq: Two times a day (BID) | ORAL | 0 refills | Status: DC

## 2019-04-15 ENCOUNTER — Telehealth: Payer: Self-pay | Admitting: *Deleted

## 2019-04-15 NOTE — Telephone Encounter (Signed)
Called and spoke with the patient and gave verbal orders per Dr Ardelle Anton and the patient understood and I stated to call the office back if any concerns or questions at [phone]. Misty Stanley

## 2019-04-15 NOTE — Telephone Encounter (Signed)
-----   Message from Vivi Barrack, DPM sent at 04/14/2019  9:38 AM EST ----- Misty Stanley- please let him know that the liver function is normal and we can start Lamisil but I want to hold off on that until I see him back. His white blood cell count is elevated and with the wounds I want to treat for infection, although it didn't clinically appear to be infected last week. I have send Augmentin to the pharmacy for him if you can let him know. If there is any increase in swelling, redness, drainage or fevers/chills or any other signs of infection he needs to let us know or go to the ER

## 2019-04-23 ENCOUNTER — Ambulatory Visit: Payer: BC Managed Care – PPO | Admitting: Podiatry

## 2019-05-08 ENCOUNTER — Telehealth: Payer: Self-pay | Admitting: *Deleted

## 2019-05-08 NOTE — Telephone Encounter (Signed)
-----   Message from Vivi Barrack, DPM sent at 04/14/2019  9:37 AM EST ----- Misty Stanley- please let him know that the liver function is normal and we can start Lamisil but I want to hold off on that until I see him back. His white blood cell count is elevated and with the wounds I want to treat for infection, although it didn't clinically appear to be infected last week. I have send Augmentin to the pharmacy for him if you can let him know. If there is any increase in swelling, redness, drainage or fevers/chills or any other signs of infection he needs to let us know or go to the ER.

## 2019-05-08 NOTE — Telephone Encounter (Signed)
This transaction completed 04/15/2019 by Hadley Pen, CMA.

## 2019-05-18 DIAGNOSIS — E1140 Type 2 diabetes mellitus with diabetic neuropathy, unspecified: Secondary | ICD-10-CM | POA: Diagnosis not present

## 2019-05-18 DIAGNOSIS — E039 Hypothyroidism, unspecified: Secondary | ICD-10-CM | POA: Diagnosis not present

## 2019-05-18 DIAGNOSIS — R21 Rash and other nonspecific skin eruption: Secondary | ICD-10-CM | POA: Diagnosis not present

## 2019-05-18 DIAGNOSIS — E1165 Type 2 diabetes mellitus with hyperglycemia: Secondary | ICD-10-CM | POA: Diagnosis not present

## 2019-06-01 ENCOUNTER — Other Ambulatory Visit (HOSPITAL_COMMUNITY): Payer: Self-pay | Admitting: Surgery

## 2019-06-01 ENCOUNTER — Other Ambulatory Visit: Payer: Self-pay | Admitting: Gastroenterology

## 2019-06-01 ENCOUNTER — Other Ambulatory Visit: Payer: Self-pay | Admitting: Surgery

## 2019-06-03 DIAGNOSIS — F5089 Other specified eating disorder: Secondary | ICD-10-CM | POA: Diagnosis not present

## 2019-06-16 ENCOUNTER — Ambulatory Visit (HOSPITAL_COMMUNITY)
Admission: RE | Admit: 2019-06-16 | Discharge: 2019-06-16 | Disposition: A | Discharge status: Home / Self Care | Payer: BC Managed Care – PPO | Source: Ambulatory Visit | Attending: Surgery | Admitting: Surgery

## 2019-06-16 ENCOUNTER — Other Ambulatory Visit: Payer: Self-pay

## 2019-06-16 DIAGNOSIS — Z01818 Encounter for other preprocedural examination: Secondary | ICD-10-CM | POA: Diagnosis not present

## 2019-06-16 DIAGNOSIS — J9811 Atelectasis: Secondary | ICD-10-CM | POA: Diagnosis not present

## 2019-06-16 DIAGNOSIS — K219 Gastro-esophageal reflux disease without esophagitis: Secondary | ICD-10-CM | POA: Diagnosis not present

## 2019-07-03 ENCOUNTER — Other Ambulatory Visit: Payer: Self-pay

## 2019-07-03 ENCOUNTER — Encounter: Payer: BC Managed Care – PPO | Attending: Surgery | Admitting: Dietician

## 2019-07-03 ENCOUNTER — Encounter: Payer: Self-pay | Admitting: Dietician

## 2019-07-03 DIAGNOSIS — E669 Obesity, unspecified: Secondary | ICD-10-CM | POA: Diagnosis not present

## 2019-07-03 NOTE — Progress Notes (Signed)
Nutrition Assessment for Bariatric Surgery Medical Nutrition Therapy   Patient was seen on 07/03/2019 for Pre-Operative Nutrition Assessment. Letter of approval faxed to [facility] bariatric surgery program coordinator on 07/03/2019.   Referral stated Supervised Weight Loss (SWL) visits needed: 0  Planned surgery: RYGB Pt expectation of surgery: would like to come off insulin and medications, have a better lifestyle, more quality time with daughter     NUTRITION ASSESSMENT   Anthropometrics  Start weight at [facility]: 341 lbs (date: 07/03/2019) Height: 76 in BMI: 41.5 kg/m2     Lifestyle & Dietary Hx Went through the bariatric surgery process about 2 years ago but decided he wasn't ready for the lifestyle change so waited until felt ready. States he overeats on the weekends when he is at home. Will prep overnight oats (with oats, almond milk, Greek yogurt, fruit), chicken wings in the air fryer, meat with a starch and vegetables for meals. May snack on potato chips or hummus and vegetables. Avoids fruits d/t the sugar, and overall tries to limit carbs. Avoids fried foods and high fat foods. Looks at nutrition labels to limit saturated fats and sugars. Counts chews at most meals.   24-Hr Dietary Recall First Meal: overnight oats  Snack: - Second Meal: grilled chicken sandwich on wheat bun  Snack: potato chips  Third Meal: chicken + brown rice + mushrooms + green beans + hot sauce Snack: - Beverages: over 1 gallon total fluids per day- diet soda, coffee w/ cream and Splenda, water (~16 oz)   NUTRITION DIAGNOSIS  Overweight/obesity (Obion-3.3) related to past poor dietary habits and physical inactivity as evidenced by patient w/ planned RYGB surgery following dietary guidelines for continued weight loss.    NUTRITION INTERVENTION  Nutrition counseling (C-1) and education (E-2) to facilitate bariatric surgery goals.  Pre-Op Goals Reviewed with the Patient . Track food and  beverage intake (pen and paper, MyFitness Pal, Baritastic app, etc.) . Make healthy food choices while monitoring portion sizes . Consume 3 meals per day or try to eat every 3-5 hours . Avoid concentrated sugars and fried foods . Keep sugar & fat in the single digits per serving on food labels . Practice CHEWING your food (aim for applesauce consistency) . Practice not drinking 15 minutes before, during, and 30 minutes after each meal and snack . Avoid all carbonated beverages (ex: soda, sparkling beverages)  . Limit caffeinated beverages (ex: coffee, tea, energy drinks) . Avoid all sugar-sweetened beverages (ex: regular soda, sports drinks)  . Avoid alcohol  . Aim for 64-100 ounces of FLUID daily (with at least half of fluid intake being plain water)  . Aim for at least 60-80 grams of PROTEIN daily . Look for a liquid protein source that contains ?15 g protein and ?5 g carbohydrate (ex: shakes, drinks, shots) . Make a list of non-food related activities . Physical activity is an important part of a healthy lifestyle so keep it moving! The goal is to reach 150 minutes of exercise per week, including cardiovascular and weight baring activity.  *Goals that are bolded indicate the pt would like to start working towards these  Handouts Provided Include  . Bariatric Surgery handouts (Nutrition Visits, Pre-Op Goals, Protein Shakes, Vitamins & Minerals)  Learning Style & Readiness for Change Teaching method utilized: Visual & Auditory  Demonstrated degree of understanding via: Teach Back  Barriers to learning/adherence to lifestyle change: None Identified    MONITORING & EVALUATION Dietary intake, weekly physical activity, body weight, and  pre-op goals reached at next nutrition visit.   Next Steps Patient is to follow up at [facility] for Pre-Op Class (>2 weeks before surgery) for further nutrition education.

## 2019-07-08 ENCOUNTER — Ambulatory Visit: Payer: BLUE CROSS/BLUE SHIELD | Admitting: Dietician

## 2019-07-13 ENCOUNTER — Encounter: Payer: BC Managed Care – PPO | Attending: Surgery | Admitting: Skilled Nursing Facility1

## 2019-07-13 ENCOUNTER — Other Ambulatory Visit: Payer: Self-pay

## 2019-07-13 DIAGNOSIS — E669 Obesity, unspecified: Secondary | ICD-10-CM | POA: Insufficient documentation

## 2019-07-13 NOTE — Progress Notes (Signed)
Pre-Operative Nutrition Class:  Appt start time: 2094   End time:  1830.  Patient was seen on 07/13/2019 for Pre-Operative Bariatric Surgery [facility]    Surgery date:  Surgery type: RYGB Start weight at [facility]: 341 Weight today: 336.3   The following the learning objectives were met by the patient during this course:  Identify Pre-Op Dietary Goals and will begin 2 weeks pre-operatively  Identify appropriate sources of fluids and proteins   State protein recommendations and appropriate sources pre and post-operatively  Identify Post-Operative Dietary Goals and will follow for 2 weeks post-operatively  Identify appropriate multivitamin and calcium sources  Describe the need for physical activity post-operatively and will follow MD recommendations  State when to call healthcare provider regarding medication questions or post-operative complications  Handouts given during class include:  Pre-Op Bariatric Surgery Diet Handout  Protein Shake Handout  Post-Op Bariatric Surgery Nutrition Handout  [facility] Program Information Flyer  Support Group Information Flyer  [facility] Bariatric Supplements Price List  Follow-Up Plan: Patient will follow-up at [facility] 2 weeks post operatively for diet advancement per MD.

## 2019-07-21 ENCOUNTER — Ambulatory Visit: Payer: BC Managed Care – PPO | Admitting: Dietician

## 2019-07-21 ENCOUNTER — Ambulatory Visit: Payer: Self-pay | Admitting: Surgery

## 2019-07-21 NOTE — H&P (View-Only) (Signed)
Surgical Evaluation  Chief Complaint: morbid obesity  HPI: returns for surgical management of severe obesity complicated by insulin-dependent diabetes.  He reports no changes in his health since our initial meeting couple of months ago and overall is doing well.  He is ready to proceed with surgery.  He has some insightful questions.  He has completed the bariatric pathway with no barriers identified. Labs were notable for hemoglobin A1c of 11.3 an slightly elevated TSH which are managed by endo Upper GI demonstrated refluxbut no hiatal hernia. He has met with psychology ([place]) and has been approved from that standpoint. He has been with the dietitians Gaylan Gerold) and is approved from that standpoint as well.  Initial visit 05/27/19: 44 year old gentleman who presents to discuss surgical management of morbid obesity with significant comorbidities.  He was initially evaluated by Dr. Redmond Pulling about 2 years ago and was slated for Roux-en-Y gastric bypass.  However, he had significant life events (death of a close friend) that caused him to delay the surgery, and when he was again ready to proceed, the global pandemic began.  Since his last evaluation here, he has continued to suffer with severe diabetes with elevated hemoglobin A1c and very high insulin requirements.  He has developed chronic wounds on his feet and reports constant pain in his feet.  He has continued to gain weight- about 14lb since he was last here.  Medical history includes diabetes, hypothyroidism, hyperlipidemia depression, ADHD.  He is status post umbilical hernia repair by Dr. Ninfa Linden in 2017.  He is a former smoker, quit in 2015.  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  initial visit, Dr Redmond Pulling Jan 2019: "The patient is a 44 year old male who presents for a bariatric surgery evaluation. He is referred by Dr Chalmers Cater for evaluation of  weight loss surgery.  He completed our seminar on line.  He is interested in a gastric bypass because his diabetes is one of his most concerning help matters.  He states that his diabetes worsened which prompted him to have to start taking insulin.  Since starting insulin he has gained approximately 30 pounds.  Over the years he has tried several different plans to help with weight loss.  He has tried Atkins, and low calorie diets-all without any long-term success.  His comorbidities include insulin-dependent diabetes, right lower back pain, bilateral knee pain, and dyslipidemia  He denies any shortness of breath, angina, dyspnea on exertion, orthopnea, paroxysmal nocturnal dyspnea or peripheral edema.  He denies any prior blood clots.  He denies any heartburn, reflux or indigestion.  He has a bowel movement every other day.  He has had an umbilical hernia repaired.  He denies any abdominal pain.  He denies any melena, hematochezia, dysuria.  He states he has discomfort in both knees as well as his right lower back.  He also states that he is now developed some neuropathy in his feet because of his diabetes.  He states that his blood sugars are improving these to be consistently greater than 300 now they're consistently greater than only 200.  He is on medication for hypothyroidism.  He denies any severe headaches, TIAs or amaurosis fugax.  He denies any tobacco or alcohol use.  He works as a Biomedical scientist..  The patient meets weight loss surgery criteria. I think the patient would be an acceptable candidate for Laparoscopic Roux-en-Y Gastric  bypass.  We discussed laparoscopic Roux-en-Y gastric bypass. We discussed the preoperative, operative and postoperative process. Using diagrams, I explained the surgery in detail including the performance of an EGD near the end of the surgery. We discussed the typical hospital course including a 2-3 day stay baring any complications. The patient was given educational material. I  quoted the patient that they can expect to lose 50-70% of their excess weight with the gastric bypass. We did discuss the possibility of weight regain several years after the procedure.  We discussed the risk and benefits of surgery including but not limited to anesthesia risk, bleeding, infection, anastomotic edema requiring a few additional days in the hospital, postop nausea, possible conversion to open procedure, blood clot formation, anastomotic leak, anastomotic stricture, ulcer formation, death, respiratory complications, intestinal blockage, internal hernia, gallstone formation, vitamin and nutritional deficiencies, hair loss, weight regain injury to surrounding structures, failure to lose weight and mood changes.  We discussed that before and after surgery that there would be an alteration in their diet. I explained that we have put them on a diet 2 weeks before surgery. I also explained that they would be on a liquid diet for 2 weeks after surgery. We discussed that they would have to avoid certain foods such as sugar after surgery. We discussed the importance of physical activity as well as compliance with our dietary and supplement recommendations and routine follow-up.  I explained to the patient that we will start our evaluation process which includes labs, Upper GI to evaluate stomach and swallowing anatomy, nutritionist consultation, psychiatrist consultation, EKG, CXR,".  336lb/ BMI 42  He remains a good candidate for Roux-en-Y gastric bypass with his increasingly difficult to control insulin-dependent diabetes. We discussed the surgery including technical aspects, the risks of bleeding, infection, pain, scarring, injury to intra-abdominal structures, staple line leak or abscess, chronic abdominal pain or nausea, DVT/PE, pneumonia, heart attack, stroke, death, failure to reach weight loss goals and weight regain, hernia, ulcer. Discussed the typical pre-, peri-, and postoperative course.  Discussed the importance of lifelong behavioral changes to combat the chronic and relapsing disease which is obesity. Questions were answered. We will reinitiate the bariatric pathway  Allergies  Allergen Reactions   Metformin And Related     Hives    Past Medical History:  Diagnosis Date   Diabetes mellitus without complication ([place])    Hyperlipidemia    Hypertension    Thyroid disease     Past Surgical History:  Procedure Laterality Date   HERNIA REPAIR      Family History  Problem Relation Age of Onset   Hyperlipidemia Other    Diabetes Other     Social History   Socioeconomic History   Marital status: Single    Spouse name: Not on file   Number of children: Not on file   Years of education: Not on file   Highest education level: Not on file  Occupational History   Not on file  Tobacco Use   Smoking status: Former Smoker    Quit date: 2015    Years since quitting: 6.3   Smokeless tobacco: Never Used  Substance and Sexual Activity   Alcohol use: Never   Drug use: No   Sexual activity: Not on file  Other Topics Concern   Not on file  Social History Narrative   Not on file   Social Determinants of Health   Financial Resource Strain:    Difficulty of Paying Living Expenses:  Food Insecurity:    Worried About Charity fundraiser in the Last Year:    Arboriculturist in the Last Year:   Transportation Needs:    Film/video editor (Medical):    Lack of Transportation (Non-Medical):   Physical Activity:    Days of Exercise per Week:    Minutes of Exercise per Session:   Stress:    Feeling of Stress :   Social Connections:    Frequency of Communication with Friends and Family:    Frequency of Social Gatherings with Friends and Family:    Attends Religious Services:    Active Member of Clubs or Organizations:    Attends Archivist Meetings:    Marital Status:     Current Outpatient Medications on File Prior to Visit  Medication Sig  Dispense Refill   amoxicillin-clavulanate (AUGMENTIN) 875-125 MG tablet Take 1 tablet by mouth 2 (two) times daily. 20 tablet 0   cephALEXin (KEFLEX) 500 MG capsule Take 1 capsule (500 mg total) by mouth 3 (three) times daily. 21 capsule 0   Insulin Aspart (NOVOLOG FLEXPEN [place]) Inject into the skin.     levothyroxine (SYNTHROID, LEVOTHROID) 112 MCG tablet Take 224 mcg by mouth daily.     VYVANSE 70 MG capsule TAKE 1 CAPSULE BY MOUTH EVERY DAY AS NEEDED. 2/5.     No current facility-administered medications on file prior to visit.    Review of Systems: a complete, 10pt review of systems was completed with pertinent positives and negatives as documented in the HPI  Physical Exam: Vitals Weight: 339.6 lb   Height: 75 in  Body Surface Area: 2.75 m   Body Mass Index: 42.45 kg/m   Temp.: 98.1 F (Temporal)    Pulse: 103 (Regular)    P.OX: 96% (Room air) BP: 150/78(Sitting, Left Arm, Large)  He is alert and well-appearing Unlabored respirations Abdomen soft, nontender    CBC Latest Ref Rng & Units 04/13/2019 07/14/2018  WBC 3.8 - 10.8 Thousand/uL 12.5(H) 12.8(H)  Hemoglobin 13.2 - 17.1 g/dL 16.0 15.4  Hematocrit 38.5 - 50.0 % 47.6 45.6  Platelets 140 - 400 Thousand/uL 298 308    CMP Latest Ref Rng & Units 04/13/2019 07/14/2018  Glucose 65 - 99 mg/dL - 269(H)  BUN 7 - 25 mg/dL - 18  Creatinine 0.60 - 1.35 mg/dL - 1.02  Sodium 135 - 146 mmol/L - 136  Potassium 3.5 - 5.3 mmol/L - 4.1  Chloride 98 - 110 mmol/L - 100  CO2 20 - 32 mmol/L - 26  Calcium 8.6 - 10.3 mg/dL - 9.3  Total Protein 6.1 - 8.1 g/dL 7.7 -  Total Bilirubin 0.2 - 1.2 mg/dL 0.4 -  AST 10 - 40 U/L 12 -  ALT 9 - 46 U/L 21 -    No results found for: INR, PROTIME  Imaging: No results found.   A/P: MORBID OBESITY WITH BODY MASS INDEX (BMI) OF 40.0 OR HIGHER (E66.01) Story: He remains a good candidate for Roux-en-Y gastric bypass with his increasingly difficult to control insulin-dependent diabetes. We have previously  discussed the surgery including technical aspects, the risks of bleeding, infection, pain, scarring, injury to intra-abdominal structures, staple line leak or abscess, chronic abdominal pain or nausea, DVT/PE, pneumonia, heart attack, stroke, death, failure to reach weight loss goals and weight regain, hernia, ulcer. Discussed the typical peri-, and postoperative course. Discussed the importance of lifelong behavioral changes to combat the chronic and relapsing disease which is obesity. Questions  were welcomed and answered. He is ready to proceed with laparoscopic Roux-en-Y gastric bypass which is currently scheduled for June 8.    Patient Active Problem List   Diagnosis Date Noted   Obesity 12/05/2016   Uncontrolled type 2 diabetes mellitus with hyperglycemia, without long-term current use of insulin ([place]) 12/05/2016       Romana Juniper, MD [facility] Surgery, [place]  See AMION to contact appropriate on-call provider

## 2019-07-21 NOTE — H&P (Signed)
Surgical Evaluation  Chief Complaint: morbid obesity  HPI: returns for surgical management of severe obesity complicated by insulin-dependent diabetes.  He reports no changes in his health since our initial meeting couple of months ago and overall is doing well.  He is ready to proceed with surgery.  He has some insightful questions.  He has completed the bariatric pathway with no barriers identified. Labs were notable for hemoglobin A1c of 11.3 an slightly elevated TSH which are managed by endo Upper GI demonstrated refluxbut no hiatal hernia. He has met with psychology ([place]) and has been approved from that standpoint. He has been with the dietitians Gaylan Gerold) and is approved from that standpoint as well.  Initial visit 05/27/19: 44 year old gentleman who presents to discuss surgical management of morbid obesity with significant comorbidities.  He was initially evaluated by Dr. Redmond Pulling about 2 years ago and was slated for Roux-en-Y gastric bypass.  However, he had significant life events (death of a close friend) that caused him to delay the surgery, and when he was again ready to proceed, the global pandemic began.  Since his last evaluation here, he has continued to suffer with severe diabetes with elevated hemoglobin A1c and very high insulin requirements.  He has developed chronic wounds on his feet and reports constant pain in his feet.  He has continued to gain weight- about 14lb since he was last here.  Medical history includes diabetes, hypothyroidism, hyperlipidemia depression, ADHD.  He is status post umbilical hernia repair by Dr. Ninfa Linden in 2017.  He is a former smoker, quit in 2015.  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  initial visit, Dr Redmond Pulling Jan 2019: "The patient is a 44 year old male who presents for a bariatric surgery evaluation. He is referred by Dr Chalmers Cater for evaluation of  weight loss surgery.  He completed our seminar on line.  He is interested in a gastric bypass because his diabetes is one of his most concerning help matters.  He states that his diabetes worsened which prompted him to have to start taking insulin.  Since starting insulin he has gained approximately 30 pounds.  Over the years he has tried several different plans to help with weight loss.  He has tried Atkins, and low calorie diets-all without any long-term success.  His comorbidities include insulin-dependent diabetes, right lower back pain, bilateral knee pain, and dyslipidemia  He denies any shortness of breath, angina, dyspnea on exertion, orthopnea, paroxysmal nocturnal dyspnea or peripheral edema.  He denies any prior blood clots.  He denies any heartburn, reflux or indigestion.  He has a bowel movement every other day.  He has had an umbilical hernia repaired.  He denies any abdominal pain.  He denies any melena, hematochezia, dysuria.  He states he has discomfort in both knees as well as his right lower back.  He also states that he is now developed some neuropathy in his feet because of his diabetes.  He states that his blood sugars are improving these to be consistently greater than 300 now they're consistently greater than only 200.  He is on medication for hypothyroidism.  He denies any severe headaches, TIAs or amaurosis fugax.  He denies any tobacco or alcohol use.  He works as a Biomedical scientist..  The patient meets weight loss surgery criteria. I think the patient would be an acceptable candidate for Laparoscopic Roux-en-Y Gastric  bypass.  We discussed laparoscopic Roux-en-Y gastric bypass. We discussed the preoperative, operative and postoperative process. Using diagrams, I explained the surgery in detail including the performance of an EGD near the end of the surgery. We discussed the typical hospital course including a 2-3 day stay baring any complications. The patient was given educational material. I  quoted the patient that they can expect to lose 50-70% of their excess weight with the gastric bypass. We did discuss the possibility of weight regain several years after the procedure.  We discussed the risk and benefits of surgery including but not limited to anesthesia risk, bleeding, infection, anastomotic edema requiring a few additional days in the hospital, postop nausea, possible conversion to open procedure, blood clot formation, anastomotic leak, anastomotic stricture, ulcer formation, death, respiratory complications, intestinal blockage, internal hernia, gallstone formation, vitamin and nutritional deficiencies, hair loss, weight regain injury to surrounding structures, failure to lose weight and mood changes.  We discussed that before and after surgery that there would be an alteration in their diet. I explained that we have put them on a diet 2 weeks before surgery. I also explained that they would be on a liquid diet for 2 weeks after surgery. We discussed that they would have to avoid certain foods such as sugar after surgery. We discussed the importance of physical activity as well as compliance with our dietary and supplement recommendations and routine follow-up.  I explained to the patient that we will start our evaluation process which includes labs, Upper GI to evaluate stomach and swallowing anatomy, nutritionist consultation, psychiatrist consultation, EKG, CXR,".  336lb/ BMI 42  He remains a good candidate for Roux-en-Y gastric bypass with his increasingly difficult to control insulin-dependent diabetes. We discussed the surgery including technical aspects, the risks of bleeding, infection, pain, scarring, injury to intra-abdominal structures, staple line leak or abscess, chronic abdominal pain or nausea, DVT/PE, pneumonia, heart attack, stroke, death, failure to reach weight loss goals and weight regain, hernia, ulcer. Discussed the typical pre-, peri-, and postoperative course.  Discussed the importance of lifelong behavioral changes to combat the chronic and relapsing disease which is obesity. Questions were answered. We will reinitiate the bariatric pathway  Allergies  Allergen Reactions   Metformin And Related     Hives    Past Medical History:  Diagnosis Date   Diabetes mellitus without complication ([place])    Hyperlipidemia    Hypertension    Thyroid disease     Past Surgical History:  Procedure Laterality Date   HERNIA REPAIR      Family History  Problem Relation Age of Onset   Hyperlipidemia Other    Diabetes Other     Social History   Socioeconomic History   Marital status: Single    Spouse name: Not on file   Number of children: Not on file   Years of education: Not on file   Highest education level: Not on file  Occupational History   Not on file  Tobacco Use   Smoking status: Former Smoker    Quit date: 2015    Years since quitting: 6.3   Smokeless tobacco: Never Used  Substance and Sexual Activity   Alcohol use: Never   Drug use: No   Sexual activity: Not on file  Other Topics Concern   Not on file  Social History Narrative   Not on file   Social Determinants of Health   Financial Resource Strain:    Difficulty of Paying Living Expenses:  Food Insecurity:    Worried About Charity fundraiser in the Last Year:    Arboriculturist in the Last Year:   Transportation Needs:    Film/video editor (Medical):    Lack of Transportation (Non-Medical):   Physical Activity:    Days of Exercise per Week:    Minutes of Exercise per Session:   Stress:    Feeling of Stress :   Social Connections:    Frequency of Communication with Friends and Family:    Frequency of Social Gatherings with Friends and Family:    Attends Religious Services:    Active Member of Clubs or Organizations:    Attends Archivist Meetings:    Marital Status:     Current Outpatient Medications on File Prior to Visit  Medication Sig  Dispense Refill   amoxicillin-clavulanate (AUGMENTIN) 875-125 MG tablet Take 1 tablet by mouth 2 (two) times daily. 20 tablet 0   cephALEXin (KEFLEX) 500 MG capsule Take 1 capsule (500 mg total) by mouth 3 (three) times daily. 21 capsule 0   Insulin Aspart (NOVOLOG FLEXPEN [place]) Inject into the skin.     levothyroxine (SYNTHROID, LEVOTHROID) 112 MCG tablet Take 224 mcg by mouth daily.     VYVANSE 70 MG capsule TAKE 1 CAPSULE BY MOUTH EVERY DAY AS NEEDED. 2/5.     No current facility-administered medications on file prior to visit.    Review of Systems: a complete, 10pt review of systems was completed with pertinent positives and negatives as documented in the HPI  Physical Exam: Vitals Weight: 339.6 lb   Height: 75 in  Body Surface Area: 2.75 m   Body Mass Index: 42.45 kg/m   Temp.: 98.1 F (Temporal)    Pulse: 103 (Regular)    P.OX: 96% (Room air) BP: 150/78(Sitting, Left Arm, Large)  He is alert and well-appearing Unlabored respirations Abdomen soft, nontender    CBC Latest Ref Rng & Units 04/13/2019 07/14/2018  WBC 3.8 - 10.8 Thousand/uL 12.5(H) 12.8(H)  Hemoglobin 13.2 - 17.1 g/dL 16.0 15.4  Hematocrit 38.5 - 50.0 % 47.6 45.6  Platelets 140 - 400 Thousand/uL 298 308    CMP Latest Ref Rng & Units 04/13/2019 07/14/2018  Glucose 65 - 99 mg/dL - 269(H)  BUN 7 - 25 mg/dL - 18  Creatinine 0.60 - 1.35 mg/dL - 1.02  Sodium 135 - 146 mmol/L - 136  Potassium 3.5 - 5.3 mmol/L - 4.1  Chloride 98 - 110 mmol/L - 100  CO2 20 - 32 mmol/L - 26  Calcium 8.6 - 10.3 mg/dL - 9.3  Total Protein 6.1 - 8.1 g/dL 7.7 -  Total Bilirubin 0.2 - 1.2 mg/dL 0.4 -  AST 10 - 40 U/L 12 -  ALT 9 - 46 U/L 21 -    No results found for: INR, PROTIME  Imaging: No results found.   A/P: MORBID OBESITY WITH BODY MASS INDEX (BMI) OF 40.0 OR HIGHER (E66.01) Story: He remains a good candidate for Roux-en-Y gastric bypass with his increasingly difficult to control insulin-dependent diabetes. We have previously  discussed the surgery including technical aspects, the risks of bleeding, infection, pain, scarring, injury to intra-abdominal structures, staple line leak or abscess, chronic abdominal pain or nausea, DVT/PE, pneumonia, heart attack, stroke, death, failure to reach weight loss goals and weight regain, hernia, ulcer. Discussed the typical peri-, and postoperative course. Discussed the importance of lifelong behavioral changes to combat the chronic and relapsing disease which is obesity. Questions  were welcomed and answered. He is ready to proceed with laparoscopic Roux-en-Y gastric bypass which is currently scheduled for June 8.    Patient Active Problem List   Diagnosis Date Noted   Obesity 12/05/2016   Uncontrolled type 2 diabetes mellitus with hyperglycemia, without long-term current use of insulin ([place]) 12/05/2016       Romana Juniper, MD [facility] Surgery, [place]  See AMION to contact appropriate on-call provider

## 2019-07-23 DIAGNOSIS — F909 Attention-deficit hyperactivity disorder, unspecified type: Secondary | ICD-10-CM | POA: Diagnosis not present

## 2019-08-06 NOTE — Patient Instructions (Addendum)
DUE TO COVID-19 ONLY ONE VISITOR IS ALLOWED TO COME WITH YOU AND STAY IN THE WAITING ROOM ONLY DURING PRE OP AND PROCEDURE DAY OF SURGERY. THE 1 VISITOR MAY VISIT WITH YOU AFTER SURGERY IN YOUR PRIVATE ROOM DURING VISITING HOURS ONLY!  YOU NEED TO HAVE A COVID 19 TEST ON: 08/14/19 @ 2:00 pm , THIS TEST MUST BE DONE BEFORE SURGERY, COME  [address], [address] [place] , [phone].  ([place]) ONCE YOUR COVID TEST IS COMPLETED, PLEASE BEGIN THE QUARANTINE INSTRUCTIONS AS OUTLINED IN YOUR HANDOUT.                Derek Prince    Your procedure is scheduled on: 08/18/19   Report to [facility] Main  Entrance   Report to admitting at: 7:45 AM     Call this number if you have problems the morning of surgery [phone]    Remember:   NO SOLID FOOD AFTER MIDNIGHT THE NIGHT PRIOR TO SURGERY. NOTHING BY MOUTH EXCEPT CLEAR LIQUIDS UNTIL: 6:45 am . PLEASE FINISH GATORADE DRINK PER SURGEON ORDER  WHICH NEEDS TO BE COMPLETED AT: 6:45 am .   CLEAR LIQUID DIET   Foods Allowed                                                                     Foods Excluded  Coffee and tea, regular and decaf                             liquids that you cannot  Plain Jell-O any favor except red or purple                                           see through such as: Fruit ices (not with fruit pulp)                                     milk, soups, orange juice  Iced Popsicles                                    All solid food Carbonated beverages, regular and diet                                    Cranberry, grape and apple juices Sports drinks like Gatorade Lightly seasoned clear broth or consume(fat free) Sugar, honey syrup  Sample Menu Breakfast                                Lunch                                     Supper Cranberry juice  Beef broth                            Chicken broth Jell-O                                     Grape juice                            Apple juice Coffee or tea                        Jell-O                                      Popsicle                                                Coffee or tea                        Coffee or tea  _____________________________________________________________________  BRUSH YOUR TEETH MORNING OF SURGERY AND RINSE YOUR MOUTH OUT, NO CHEWING GUM CANDY OR MINTS.     Take these medicines the morning of surgery with A SIP OF WATER: levothyroxine  How to Manage Your Diabetes Before and After Surgery  Why is it important to control my blood sugar before and after surgery? . Improving blood sugar levels before and after surgery helps healing and can limit problems. . A way of improving blood sugar control is eating a healthy diet by: o  Eating less sugar and carbohydrates o  Increasing activity/exercise o  Talking with your doctor about reaching your blood sugar goals . High blood sugars (greater than 180 mg/dL) can raise your risk of infections and slow your recovery, so you will need to focus on controlling your diabetes during the weeks before surgery. . Make sure that the doctor who takes care of your diabetes knows about your planned surgery including the date and location.  How do I manage my blood sugar before surgery? . Check your blood sugar at least 4 times a day, starting 2 days before surgery, to make sure that the level is not too high or low. o Check your blood sugar the morning of your surgery when you wake up and every 2 hours until you get to the Short Stay unit. . If your blood sugar is less than 70 mg/dL, you will need to treat for low blood sugar: o Do not take insulin. o Treat a low blood sugar (less than 70 mg/dL) with  cup of clear juice (cranberry or apple), 4 glucose tablets, OR glucose gel. o Recheck blood sugar in 15 minutes after treatment (to make sure it is greater than 70 mg/dL). If your blood sugar is not greater than 70 mg/dL on recheck, call [phone]  for further instructions. . Report your blood sugar to the short stay nurse when you get to Short Stay.  . If you are admitted to the hospital after surgery: o Your blood sugar will be checked by the staff and you will probably be given  insulin after surgery (instead of oral diabetes medicines) to make sure you have good blood sugar levels. o The goal for blood sugar control after surgery is 80-180 mg/dL.   WHAT DO I DO ABOUT MY DIABETES MEDICATION?  Marland Kitchen Do not take oral diabetes medicines (pills) the morning of surgery.  . THE DAY BEFORE SURGERY, take Metformin as usual.Use the usual sliding scale for Novolog 70/30 at breakfast and lunch.Use 70% of the dose at dinner time.       DO NOT TAKE ANY DIABETIC MEDICATIONS DAY OF YOUR SURGERY                               You may not have any metal on your body including hair pins and              piercings  Do not wear jewelry, lotions, powders or perfumes, deodorant             Men may shave face and neck.   Do not bring valuables to the hospital. [facility] IS NOT             RESPONSIBLE   FOR VALUABLES.  Contacts, dentures or bridgework may not be worn into surgery.  Leave suitcase in the car. After surgery it may be brought to your room.     Patients discharged the day of surgery will not be allowed to drive home. IF YOU ARE HAVING SURGERY AND GOING HOME THE SAME DAY, YOU MUST HAVE AN ADULT TO DRIVE YOU HOME AND BE WITH YOU FOR 24 HOURS. YOU MAY GO HOME BY TAXI OR UBER OR ORTHERWISE, BUT AN ADULT MUST ACCOMPANY YOU HOME AND STAY WITH YOU FOR 24 HOURS.  Name and phone number of your driver:  Special Instructions: N/A              Please read over the following fact sheets you were given: _____________________________________________________________________  [facility] - Preparing for Surgery Before surgery, you can play an important role.  Because skin is not sterile, your skin needs to be as free of germs as possible.  You can reduce  the number of germs on your skin by washing with CHG (chlorahexidine gluconate) soap before surgery.  CHG is an antiseptic cleaner which kills germs and bonds with the skin to continue killing germs even after washing. Please DO NOT use if you have an allergy to CHG or antibacterial soaps.  If your skin becomes reddened/irritated stop using the CHG and inform your nurse when you arrive at Short Stay. Do not shave (including legs and underarms) for at least 48 hours prior to the first CHG shower.  You may shave your face/neck. Please follow these instructions carefully:  1.  Shower with CHG Soap the night before surgery and the  morning of Surgery.  2.  If you choose to wash your hair, wash your hair first as usual with your  normal  shampoo.  3.  After you shampoo, rinse your hair and body thoroughly to remove the  shampoo.                           4.  Use CHG as you would any other liquid soap.  You can apply chg directly  to the skin and wash  Gently with a scrungie or clean washcloth.  5.  Apply the CHG Soap to your body ONLY FROM THE NECK DOWN.   Do not use on face/ open                           Wound or open sores. Avoid contact with eyes, ears mouth and genitals (private parts).                       Wash face,  Genitals (private parts) with your normal soap.             6.  Wash thoroughly, paying special attention to the area where your surgery  will be performed.  7.  Thoroughly rinse your body with warm water from the neck down.  8.  DO NOT shower/wash with your normal soap after using and rinsing off  the CHG Soap.                9.  Pat yourself dry with a clean towel.            10.  Wear clean pajamas.            11.  Place clean sheets on your bed the night of your first shower and do not  sleep with pets. Day of Surgery : Do not apply any lotions/deodorants the morning of surgery.  Please wear clean clothes to the hospital/surgery center.  FAILURE TO FOLLOW  THESE INSTRUCTIONS MAY RESULT IN THE CANCELLATION OF YOUR SURGERY PATIENT SIGNATURE_________________________________  NURSE SIGNATURE__________________________________  ________________________________________________________________________   Adam Phenix  An incentive spirometer is a tool that can help keep your lungs clear and active. This tool measures how well you are filling your lungs with each breath. Taking long deep breaths may help reverse or decrease the chance of developing breathing (pulmonary) problems (especially infection) following:  A long period of time when you are unable to move or be active. BEFORE THE PROCEDURE   If the spirometer includes an indicator to show your best effort, your nurse or respiratory therapist will set it to a desired goal.  If possible, sit up straight or lean slightly forward. Try not to slouch.  Hold the incentive spirometer in an upright position. INSTRUCTIONS FOR USE  1. Sit on the edge of your bed if possible, or sit up as far as you can in bed or on a chair. 2. Hold the incentive spirometer in an upright position. 3. Breathe out normally. 4. Place the mouthpiece in your mouth and seal your lips tightly around it. 5. Breathe in slowly and as deeply as possible, raising the piston or the ball toward the top of the column. 6. Hold your breath for 3-5 seconds or for as long as possible. Allow the piston or ball to fall to the bottom of the column. 7. Remove the mouthpiece from your mouth and breathe out normally. 8. Rest for a few seconds and repeat Steps 1 through 7 at least 10 times every 1-2 hours when you are awake. Take your time and take a few normal breaths between deep breaths. 9. The spirometer may include an indicator to show your best effort. Use the indicator as a goal to work toward during each repetition. 10. After each set of 10 deep breaths, practice coughing to be sure your lungs are clear. If you have an incision  (the cut made at the time of  surgery), support your incision when coughing by placing a pillow or rolled up towels firmly against it. Once you are able to get out of bed, walk around indoors and cough well. You may stop using the incentive spirometer when instructed by your caregiver.  RISKS AND COMPLICATIONS  Take your time so you do not get dizzy or light-headed.  If you are in pain, you may need to take or ask for pain medication before doing incentive spirometry. It is harder to take a deep breath if you are having pain. AFTER USE  Rest and breathe slowly and easily.  It can be helpful to keep track of a log of your progress. Your caregiver can provide you with a simple table to help with this. If you are using the spirometer at home, follow these instructions: [place] IF:   You are having difficultly using the spirometer.  You have trouble using the spirometer as often as instructed.  Your pain medication is not giving enough relief while using the spirometer.  You develop fever of 100.5 F (38.1 C) or higher. SEEK IMMEDIATE MEDICAL CARE IF:   You cough up bloody sputum that had not been present before.  You develop fever of 102 F (38.9 C) or greater.  You develop worsening pain at or near the incision site. MAKE SURE YOU:   Understand these instructions.  Will watch your condition.  Will get help right away if you are not doing well or get worse. Document Released: 07/09/2006 Document Revised: 05/21/2011 Document Reviewed: 09/09/2006 [facility] Patient Information 2014 [place], [place].   ________________________________________________________________________

## 2019-08-07 ENCOUNTER — Encounter (HOSPITAL_COMMUNITY)
Admission: RE | Admit: 2019-08-07 | Discharge: 2019-08-07 | Disposition: A | Discharge status: Home / Self Care | Payer: BC Managed Care – PPO | Source: Ambulatory Visit | Attending: Surgery | Admitting: Surgery

## 2019-08-07 ENCOUNTER — Encounter (HOSPITAL_COMMUNITY): Payer: Self-pay

## 2019-08-07 ENCOUNTER — Other Ambulatory Visit: Payer: Self-pay

## 2019-08-07 DIAGNOSIS — Z01812 Encounter for preprocedural laboratory examination: Secondary | ICD-10-CM | POA: Insufficient documentation

## 2019-08-07 HISTORY — DX: Hypothyroidism, unspecified: E03.9

## 2019-08-07 HISTORY — DX: Depression, unspecified: F32.A

## 2019-08-07 LAB — ABO/RH: ABO/RH(D): B POS

## 2019-08-07 LAB — TYPE AND SCREEN
ABO/RH(D): B POS
Antibody Screen: NEGATIVE

## 2019-08-07 LAB — COMPREHENSIVE METABOLIC PANEL
ALT: 27 U/L (ref 0–44)
AST: 22 U/L (ref 15–41)
Albumin: 3.9 g/dL (ref 3.5–5.0)
Alkaline Phosphatase: 81 U/L (ref 38–126)
Anion gap: 10 (ref 5–15)
BUN: 17 mg/dL (ref 6–20)
CO2: 25 mmol/L (ref 22–32)
Calcium: 8.9 mg/dL (ref 8.9–10.3)
Chloride: 102 mmol/L (ref 98–111)
Creatinine, Ser: 0.87 mg/dL (ref 0.61–1.24)
GFR calc Af Amer: >60 mL/min (ref >60)
GFR calc non Af Amer: >60 mL/min (ref >60)
Glucose, Bld: 185 mg/dL — ABNORMAL HIGH (ref 70–99)
Potassium: 4.2 mmol/L (ref 3.5–5.1)
Sodium: 137 mmol/L (ref 135–145)
Total Bilirubin: 0.7 mg/dL (ref 0.3–1.2)
Total Protein: 7.8 g/dL (ref 6.5–8.1)

## 2019-08-07 LAB — CBC WITH DIFFERENTIAL/PLATELET
Abs Immature Granulocytes: 0.1 10*3/uL — ABNORMAL HIGH (ref 0.00–0.07)
Basophils Absolute: 0.1 10*3/uL (ref 0.0–0.1)
Basophils Relative: 1 %
Eosinophils Absolute: 0.2 10*3/uL (ref 0.0–0.5)
Eosinophils Relative: 1 %
HCT: 46.7 % (ref 39.0–52.0)
Hemoglobin: 15.3 g/dL (ref 13.0–17.0)
Immature Granulocytes: 1 %
Lymphocytes Relative: 18 %
Lymphs Abs: 2.5 10*3/uL (ref 0.7–4.0)
MCH: 26.9 pg (ref 26.0–34.0)
MCHC: 32.8 g/dL (ref 30.0–36.0)
MCV: 82.1 fL (ref 80.0–100.0)
Monocytes Absolute: 0.8 10*3/uL (ref 0.1–1.0)
Monocytes Relative: 6 %
Neutro Abs: 10.6 10*3/uL — ABNORMAL HIGH (ref 1.7–7.7)
Neutrophils Relative %: 73 %
Platelets: 319 10*3/uL (ref 150–400)
RBC: 5.69 MIL/uL (ref 4.22–5.81)
RDW: 13.3 % (ref 11.5–15.5)
WBC: 14.3 10*3/uL — ABNORMAL HIGH (ref 4.0–10.5)
nRBC: 0 % (ref 0.0–0.2)

## 2019-08-07 NOTE — Progress Notes (Signed)
COVID Vaccine Completed: No Date COVID Vaccine completed: COVID vaccine manufacturer: [employer]   PCP - Dr. [name]. LOV: 04/07/19 Cardiologist -   Chest x-ray - 06/16/19 EKG - 06/16/19 Stress Test -  ECHO -  Cardiac Cath -   Sleep Study -  CPAP -   Fasting Blood Sugar - 180's Checks Blood Sugar ___4__ times a day  Blood Thinner Instructions: Aspirin Instructions: Last Dose:  Anesthesia review: EKG: Right  BBB.  Patient denies shortness of breath, fever, cough and chest pain at PAT appointment   Patient verbalized understanding of instructions that were given to them at the PAT appointment. Patient was also instructed that they will need to review over the PAT instructions again at home before surgery.

## 2019-08-14 ENCOUNTER — Other Ambulatory Visit (HOSPITAL_COMMUNITY)
Admission: RE | Admit: 2019-08-14 | Discharge: 2019-08-14 | Disposition: A | Discharge status: Home / Self Care | Payer: BC Managed Care – PPO | Source: Ambulatory Visit | Attending: Surgery | Admitting: Surgery

## 2019-08-14 DIAGNOSIS — Z01812 Encounter for preprocedural laboratory examination: Secondary | ICD-10-CM | POA: Diagnosis not present

## 2019-08-14 DIAGNOSIS — Z20822 Contact with and (suspected) exposure to covid-19: Secondary | ICD-10-CM | POA: Diagnosis not present

## 2019-08-14 LAB — SARS CORONAVIRUS 2 (TAT 6-24 HRS): SARS Coronavirus 2: NEGATIVE

## 2019-08-17 MED ORDER — BUPIVACAINE LIPOSOME 1.3 % IJ SUSP
20.0000 mL | Freq: Once | INTRAMUSCULAR | Status: DC
  Filled 2019-08-17: qty 20

## 2019-08-18 ENCOUNTER — Encounter (HOSPITAL_COMMUNITY)
Admission: RE | Disposition: A | Discharge status: Home / Self Care | Payer: Self-pay | Source: Home / Self Care | Attending: Surgery

## 2019-08-18 ENCOUNTER — Other Ambulatory Visit: Payer: Self-pay

## 2019-08-18 ENCOUNTER — Encounter (HOSPITAL_COMMUNITY): Payer: Self-pay | Admitting: Surgery

## 2019-08-18 ENCOUNTER — Inpatient Hospital Stay (HOSPITAL_COMMUNITY): Payer: BC Managed Care – PPO | Admitting: Physician Assistant

## 2019-08-18 ENCOUNTER — Inpatient Hospital Stay (HOSPITAL_COMMUNITY): Payer: BC Managed Care – PPO | Admitting: Anesthesiology

## 2019-08-18 ENCOUNTER — Inpatient Hospital Stay (HOSPITAL_COMMUNITY)
Admission: RE | Admit: 2019-08-18 | Discharge: 2019-08-19 | DRG: 621 | Disposition: A | Discharge status: Home / Self Care | Payer: BC Managed Care – PPO | Attending: Surgery | Admitting: Surgery

## 2019-08-18 DIAGNOSIS — Z87891 Personal history of nicotine dependence: Secondary | ICD-10-CM | POA: Diagnosis not present

## 2019-08-18 DIAGNOSIS — Z79899 Other long term (current) drug therapy: Secondary | ICD-10-CM | POA: Diagnosis not present

## 2019-08-18 DIAGNOSIS — Z833 Family history of diabetes mellitus: Secondary | ICD-10-CM

## 2019-08-18 DIAGNOSIS — Z888 Allergy status to other drugs, medicaments and biological substances status: Secondary | ICD-10-CM

## 2019-08-18 DIAGNOSIS — I10 Essential (primary) hypertension: Secondary | ICD-10-CM | POA: Diagnosis present

## 2019-08-18 DIAGNOSIS — Z79890 Hormone replacement therapy (postmenopausal): Secondary | ICD-10-CM

## 2019-08-18 DIAGNOSIS — F909 Attention-deficit hyperactivity disorder, unspecified type: Secondary | ICD-10-CM | POA: Diagnosis not present

## 2019-08-18 DIAGNOSIS — K660 Peritoneal adhesions (postprocedural) (postinfection): Secondary | ICD-10-CM | POA: Diagnosis not present

## 2019-08-18 DIAGNOSIS — Z794 Long term (current) use of insulin: Secondary | ICD-10-CM

## 2019-08-18 DIAGNOSIS — Z6841 Body Mass Index (BMI) 40.0 and over, adult: Secondary | ICD-10-CM

## 2019-08-18 DIAGNOSIS — E119 Type 2 diabetes mellitus without complications: Secondary | ICD-10-CM | POA: Diagnosis not present

## 2019-08-18 DIAGNOSIS — E039 Hypothyroidism, unspecified: Secondary | ICD-10-CM | POA: Diagnosis not present

## 2019-08-18 DIAGNOSIS — E1140 Type 2 diabetes mellitus with diabetic neuropathy, unspecified: Secondary | ICD-10-CM | POA: Diagnosis present

## 2019-08-18 DIAGNOSIS — E1165 Type 2 diabetes mellitus with hyperglycemia: Secondary | ICD-10-CM | POA: Diagnosis not present

## 2019-08-18 HISTORY — PX: GASTRIC ROUX-EN-Y: SHX5262

## 2019-08-18 LAB — GLUCOSE, CAPILLARY
Glucose-Capillary: 109 mg/dL — ABNORMAL HIGH (ref 70–99)
Glucose-Capillary: 227 mg/dL — ABNORMAL HIGH (ref 70–99)
Glucose-Capillary: 237 mg/dL — ABNORMAL HIGH (ref 70–99)
Glucose-Capillary: 251 mg/dL — ABNORMAL HIGH (ref 70–99)
Glucose-Capillary: 260 mg/dL — ABNORMAL HIGH (ref 70–99)
Glucose-Capillary: 264 mg/dL — ABNORMAL HIGH (ref 70–99)

## 2019-08-18 SURGERY — LAPAROSCOPIC ROUX-EN-Y GASTRIC BYPASS WITH UPPER ENDOSCOPY
Anesthesia: General | Site: Abdomen

## 2019-08-18 MED ORDER — LIDOCAINE 2% (20 MG/ML) 5 ML SYRINGE
INTRAMUSCULAR | Status: AC
  Filled 2019-08-18: qty 5

## 2019-08-18 MED ORDER — ACETAMINOPHEN 160 MG/5ML PO SOLN: 1000.0000 mg | Freq: Three times a day (TID) | ORAL | Status: DC

## 2019-08-18 MED ORDER — ACETAMINOPHEN 500 MG PO TABS
1000.0000 mg | ORAL_TABLET | ORAL | Status: AC
  Administered 2019-08-18: 1000 mg via ORAL
  Filled 2019-08-18: qty 2

## 2019-08-18 MED ORDER — ACETAMINOPHEN 500 MG PO TABS
1000.0000 mg | ORAL_TABLET | Freq: Three times a day (TID) | ORAL | Status: DC
  Administered 2019-08-18 – 2019-08-19 (×4): 1000 mg via ORAL
  Filled 2019-08-18 (×4): qty 2

## 2019-08-18 MED ORDER — METHOCARBAMOL 1000 MG/10ML IJ SOLN
500.0000 mg | Freq: Four times a day (QID) | INTRAVENOUS | Status: DC | PRN
  Filled 2019-08-18: qty 5

## 2019-08-18 MED ORDER — FENTANYL CITRATE (PF) 250 MCG/5ML IJ SOLN
INTRAMUSCULAR | Status: AC
  Filled 2019-08-18: qty 5

## 2019-08-18 MED ORDER — ONDANSETRON HCL 4 MG/2ML IJ SOLN: 4.0000 mg | Freq: Once | INTRAMUSCULAR | Status: DC | PRN

## 2019-08-18 MED ORDER — "VISTASEAL 4 ML SINGLE DOSE KIT "
4.0000 mL | PACK | Freq: Once | CUTANEOUS | Status: AC
  Administered 2019-08-18: 4 mL via TOPICAL
  Filled 2019-08-18: qty 4

## 2019-08-18 MED ORDER — PHENYLEPHRINE 40 MCG/ML (10ML) SYRINGE FOR IV PUSH (FOR BLOOD PRESSURE SUPPORT)
PREFILLED_SYRINGE | INTRAVENOUS | Status: AC
  Filled 2019-08-18: qty 10

## 2019-08-18 MED ORDER — LISDEXAMFETAMINE DIMESYLATE 70 MG PO CAPS: 70.0000 mg | ORAL_CAPSULE | ORAL | Status: DC

## 2019-08-18 MED ORDER — ACETAMINOPHEN 160 MG/5ML PO SOLN: 325.0000 mg | ORAL | Status: DC | PRN

## 2019-08-18 MED ORDER — DEXAMETHASONE SODIUM PHOSPHATE 10 MG/ML IJ SOLN
INTRAMUSCULAR | Status: AC
  Filled 2019-08-18: qty 1

## 2019-08-18 MED ORDER — PROPOFOL 10 MG/ML IV BOLUS
INTRAVENOUS | Status: DC | PRN
  Administered 2019-08-18: 300 mg via INTRAVENOUS

## 2019-08-18 MED ORDER — FENTANYL CITRATE (PF) 250 MCG/5ML IJ SOLN
INTRAMUSCULAR | Status: DC | PRN
  Administered 2019-08-18: 100 ug via INTRAVENOUS
  Administered 2019-08-18 (×3): 50 ug via INTRAVENOUS

## 2019-08-18 MED ORDER — KETOROLAC TROMETHAMINE 15 MG/ML IJ SOLN: 15.0000 mg | Freq: Three times a day (TID) | INTRAMUSCULAR | Status: DC | PRN

## 2019-08-18 MED ORDER — HYDROMORPHONE HCL 2 MG/ML IJ SOLN
INTRAMUSCULAR | Status: AC
  Filled 2019-08-18: qty 1

## 2019-08-18 MED ORDER — INSULIN ASPART 100 UNIT/ML ~~LOC~~ SOLN
0.0000 [IU] | SUBCUTANEOUS | Status: DC
  Administered 2019-08-18 (×2): 11 [IU] via SUBCUTANEOUS
  Administered 2019-08-19: 4 [IU] via SUBCUTANEOUS
  Administered 2019-08-19: 7 [IU] via SUBCUTANEOUS
  Administered 2019-08-19 (×2): 4 [IU] via SUBCUTANEOUS

## 2019-08-18 MED ORDER — GABAPENTIN 300 MG PO CAPS
300.0000 mg | ORAL_CAPSULE | ORAL | Status: AC
  Administered 2019-08-18: 300 mg via ORAL
  Filled 2019-08-18: qty 1

## 2019-08-18 MED ORDER — KETAMINE HCL 10 MG/ML IJ SOLN
INTRAMUSCULAR | Status: DC | PRN
Start: 2019-08-18 — End: 2019-08-18
  Administered 2019-08-18: 40 mg via INTRAVENOUS

## 2019-08-18 MED ORDER — BUPIVACAINE HCL (PF) 0.25 % IJ SOLN
INTRAMUSCULAR | Status: AC
  Filled 2019-08-18: qty 30

## 2019-08-18 MED ORDER — APREPITANT 40 MG PO CAPS
40.0000 mg | ORAL_CAPSULE | ORAL | Status: AC
  Administered 2019-08-18: 40 mg via ORAL
  Filled 2019-08-18: qty 1

## 2019-08-18 MED ORDER — MIDAZOLAM HCL 5 MG/5ML IJ SOLN
INTRAMUSCULAR | Status: DC | PRN
  Administered 2019-08-18: 2 mg via INTRAVENOUS

## 2019-08-18 MED ORDER — SIMETHICONE 80 MG PO CHEW: 80.0000 mg | CHEWABLE_TABLET | Freq: Four times a day (QID) | ORAL | Status: DC | PRN

## 2019-08-18 MED ORDER — HYDROMORPHONE HCL 1 MG/ML IJ SOLN
INTRAMUSCULAR | Status: DC | PRN
  Administered 2019-08-18 (×2): .5 mg via INTRAVENOUS

## 2019-08-18 MED ORDER — LACTATED RINGERS IV SOLN: INTRAVENOUS | Status: DC

## 2019-08-18 MED ORDER — FENTANYL CITRATE (PF) 100 MCG/2ML IJ SOLN
25.0000 ug | INTRAMUSCULAR | Status: DC | PRN
  Administered 2019-08-18 (×2): 50 ug via INTRAVENOUS

## 2019-08-18 MED ORDER — DEXAMETHASONE SODIUM PHOSPHATE 10 MG/ML IJ SOLN
INTRAMUSCULAR | Status: DC | PRN
  Administered 2019-08-18: 8 mg via INTRAVENOUS

## 2019-08-18 MED ORDER — FIBRIN SEALANT 2 ML SINGLE DOSE KIT
2.0000 mL | PACK | Freq: Once | CUTANEOUS | Status: AC
  Administered 2019-08-18: 2 mL via TOPICAL
  Filled 2019-08-18: qty 2

## 2019-08-18 MED ORDER — SCOPOLAMINE 1 MG/3DAYS TD PT72
1.0000 | MEDICATED_PATCH | TRANSDERMAL | Status: DC
  Administered 2019-08-18: 1.5 mg via TRANSDERMAL
  Filled 2019-08-18: qty 1

## 2019-08-18 MED ORDER — ENSURE MAX PROTEIN PO LIQD
2.0000 [oz_av] | ORAL | Status: DC
  Administered 2019-08-19 (×4): 2 [oz_av] via ORAL

## 2019-08-18 MED ORDER — BUPIVACAINE LIPOSOME 1.3 % IJ SUSP
INTRAMUSCULAR | Status: DC | PRN
  Administered 2019-08-18: 20 mL

## 2019-08-18 MED ORDER — PANTOPRAZOLE SODIUM 40 MG IV SOLR
40.0000 mg | Freq: Every day | INTRAVENOUS | Status: DC
  Administered 2019-08-18: 40 mg via INTRAVENOUS
  Filled 2019-08-18: qty 40

## 2019-08-18 MED ORDER — MIDAZOLAM HCL 2 MG/2ML IJ SOLN
INTRAMUSCULAR | Status: AC
  Filled 2019-08-18: qty 2

## 2019-08-18 MED ORDER — HYDRALAZINE HCL 20 MG/ML IJ SOLN: 10.0000 mg | INTRAMUSCULAR | Status: DC | PRN

## 2019-08-18 MED ORDER — OXYCODONE HCL 5 MG PO TABS: 5.0000 mg | ORAL_TABLET | Freq: Once | ORAL | Status: DC | PRN

## 2019-08-18 MED ORDER — GABAPENTIN 100 MG PO CAPS
200.0000 mg | ORAL_CAPSULE | Freq: Two times a day (BID) | ORAL | Status: DC
  Administered 2019-08-18 – 2019-08-19 (×2): 200 mg via ORAL
  Filled 2019-08-18 (×3): qty 2

## 2019-08-18 MED ORDER — ROCURONIUM BROMIDE 10 MG/ML (PF) SYRINGE
PREFILLED_SYRINGE | INTRAVENOUS | Status: DC | PRN
  Administered 2019-08-18: 10 mg via INTRAVENOUS
  Administered 2019-08-18: 20 mg via INTRAVENOUS
  Administered 2019-08-18: 10 mg via INTRAVENOUS
  Administered 2019-08-18: 80 mg via INTRAVENOUS

## 2019-08-18 MED ORDER — BUPIVACAINE HCL (PF) 0.25 % IJ SOLN
INTRAMUSCULAR | Status: DC | PRN
  Administered 2019-08-18: 30 mL

## 2019-08-18 MED ORDER — ACETAMINOPHEN 325 MG PO TABS: 325.0000 mg | ORAL_TABLET | ORAL | Status: DC | PRN

## 2019-08-18 MED ORDER — OXYCODONE HCL 5 MG/5ML PO SOLN: 5.0000 mg | Freq: Once | ORAL | Status: DC | PRN

## 2019-08-18 MED ORDER — FENTANYL CITRATE (PF) 100 MCG/2ML IJ SOLN
INTRAMUSCULAR | Status: AC
  Filled 2019-08-18: qty 2

## 2019-08-18 MED ORDER — LIDOCAINE HCL 2 % IJ SOLN
INTRAMUSCULAR | Status: AC
  Filled 2019-08-18: qty 20

## 2019-08-18 MED ORDER — ONDANSETRON HCL 4 MG/2ML IJ SOLN
INTRAMUSCULAR | Status: AC
  Filled 2019-08-18: qty 2

## 2019-08-18 MED ORDER — METOPROLOL TARTRATE 5 MG/5ML IV SOLN: 5.0000 mg | Freq: Four times a day (QID) | INTRAVENOUS | Status: DC | PRN

## 2019-08-18 MED ORDER — METOCLOPRAMIDE HCL 5 MG/ML IJ SOLN
10.0000 mg | Freq: Four times a day (QID) | INTRAMUSCULAR | Status: DC
  Administered 2019-08-18 – 2019-08-19 (×4): 10 mg via INTRAVENOUS
  Filled 2019-08-18 (×4): qty 2

## 2019-08-18 MED ORDER — ONDANSETRON HCL 4 MG/2ML IJ SOLN
INTRAMUSCULAR | Status: DC | PRN
  Administered 2019-08-18: 4 mg via INTRAVENOUS

## 2019-08-18 MED ORDER — SODIUM CHLORIDE 0.9 % IV SOLN: INTRAVENOUS | Status: DC

## 2019-08-18 MED ORDER — LACTATED RINGERS IR SOLN
Status: DC | PRN
  Administered 2019-08-18: 1000 mL

## 2019-08-18 MED ORDER — HEPARIN SODIUM (PORCINE) 5000 UNIT/ML IJ SOLN
5000.0000 [IU] | INTRAMUSCULAR | Status: AC
  Administered 2019-08-18: 5000 [IU] via SUBCUTANEOUS
  Filled 2019-08-18: qty 1

## 2019-08-18 MED ORDER — OXYCODONE HCL 5 MG/5ML PO SOLN
5.0000 mg | Freq: Four times a day (QID) | ORAL | Status: DC | PRN
  Administered 2019-08-18: 5 mg via ORAL
  Filled 2019-08-18: qty 5

## 2019-08-18 MED ORDER — INSULIN ASPART 100 UNIT/ML ~~LOC~~ SOLN
10.0000 [IU] | Freq: Once | SUBCUTANEOUS | Status: AC
  Administered 2019-08-18: 10 [IU] via SUBCUTANEOUS

## 2019-08-18 MED ORDER — STERILE WATER FOR IRRIGATION IR SOLN
Status: DC | PRN
  Administered 2019-08-18: 2000 mL

## 2019-08-18 MED ORDER — LEVOTHYROXINE SODIUM 112 MCG PO TABS
224.0000 ug | ORAL_TABLET | Freq: Every day | ORAL | Status: DC
  Administered 2019-08-19: 224 ug via ORAL
  Filled 2019-08-18: qty 2

## 2019-08-18 MED ORDER — DOCUSATE SODIUM 100 MG PO CAPS
100.0000 mg | ORAL_CAPSULE | Freq: Two times a day (BID) | ORAL | Status: DC
  Administered 2019-08-18 – 2019-08-19 (×2): 100 mg via ORAL
  Filled 2019-08-18 (×2): qty 1

## 2019-08-18 MED ORDER — HYDROMORPHONE HCL 1 MG/ML IJ SOLN: 0.5000 mg | INTRAMUSCULAR | Status: DC | PRN

## 2019-08-18 MED ORDER — LIDOCAINE 20MG/ML (2%) 15 ML SYRINGE OPTIME
INTRAMUSCULAR | Status: DC | PRN
  Administered 2019-08-18: 1.5 mg/kg/h via INTRAVENOUS

## 2019-08-18 MED ORDER — MEPERIDINE HCL 50 MG/ML IJ SOLN: 6.2500 mg | INTRAMUSCULAR | Status: DC | PRN

## 2019-08-18 MED ORDER — ROCURONIUM BROMIDE 10 MG/ML (PF) SYRINGE
PREFILLED_SYRINGE | INTRAVENOUS | Status: AC
  Filled 2019-08-18: qty 10

## 2019-08-18 MED ORDER — ORAL CARE MOUTH RINSE: 15.0000 mL | Freq: Once | OROMUCOSAL | Status: AC

## 2019-08-18 MED ORDER — 0.9 % SODIUM CHLORIDE (POUR BTL) OPTIME
TOPICAL | Status: DC | PRN
  Administered 2019-08-18: 1000 mL

## 2019-08-18 MED ORDER — SODIUM CHLORIDE 0.9 % IV SOLN
2.0000 g | INTRAVENOUS | Status: AC
  Administered 2019-08-18: 2 g via INTRAVENOUS
  Filled 2019-08-18: qty 2

## 2019-08-18 MED ORDER — ONDANSETRON HCL 4 MG/2ML IJ SOLN: 4.0000 mg | INTRAMUSCULAR | Status: DC | PRN

## 2019-08-18 MED ORDER — SUGAMMADEX SODIUM 500 MG/5ML IV SOLN
INTRAVENOUS | Status: AC
  Filled 2019-08-18: qty 5

## 2019-08-18 MED ORDER — CHLORHEXIDINE GLUCONATE 0.12 % MT SOLN
15.0000 mL | Freq: Once | OROMUCOSAL | Status: AC
  Administered 2019-08-18: 15 mL via OROMUCOSAL

## 2019-08-18 MED ORDER — CHLORHEXIDINE GLUCONATE 4 % EX LIQD: 60.0000 mL | Freq: Once | CUTANEOUS | Status: DC

## 2019-08-18 MED ORDER — TRAMADOL HCL 50 MG PO TABS: 50.0000 mg | ORAL_TABLET | Freq: Four times a day (QID) | ORAL | Status: DC | PRN

## 2019-08-18 MED ORDER — PROPOFOL 10 MG/ML IV BOLUS
INTRAVENOUS | Status: AC
  Filled 2019-08-18: qty 20

## 2019-08-18 MED ORDER — INSULIN ASPART 100 UNIT/ML ~~LOC~~ SOLN
SUBCUTANEOUS | Status: AC
  Filled 2019-08-18: qty 1

## 2019-08-18 MED ORDER — ENOXAPARIN SODIUM 30 MG/0.3ML ~~LOC~~ SOLN
30.0000 mg | Freq: Two times a day (BID) | SUBCUTANEOUS | Status: DC
  Administered 2019-08-18 – 2019-08-19 (×2): 30 mg via SUBCUTANEOUS
  Filled 2019-08-18 (×2): qty 0.3

## 2019-08-18 MED ORDER — PHENYLEPHRINE 40 MCG/ML (10ML) SYRINGE FOR IV PUSH (FOR BLOOD PRESSURE SUPPORT)
PREFILLED_SYRINGE | INTRAVENOUS | Status: DC | PRN
  Administered 2019-08-18: 80 ug via INTRAVENOUS

## 2019-08-18 MED ORDER — SUGAMMADEX SODIUM 500 MG/5ML IV SOLN
INTRAVENOUS | Status: DC | PRN
Start: 2019-08-18 — End: 2019-08-18
  Administered 2019-08-18: 308.4 mg via INTRAVENOUS

## 2019-08-18 SURGICAL SUPPLY — 77 items
APPLICATOR COTTON TIP 6 STRL (MISCELLANEOUS) IMPLANT
APPLICATOR COTTON TIP 6IN STRL (MISCELLANEOUS)
APPLICATOR VISTASEAL 35 (MISCELLANEOUS) ×4 IMPLANT
APPLIER CLIP ROT 13.4 12 LRG (CLIP)
BENZOIN TINCTURE PRP APPL 2/3 (GAUZE/BANDAGES/DRESSINGS) ×2 IMPLANT
BLADE SURG SZ11 CARB STEEL (BLADE) ×2 IMPLANT
BNDG ADH 1X3 SHEER STRL LF (GAUZE/BANDAGES/DRESSINGS) ×12 IMPLANT
CABLE HIGH FREQUENCY MONO STRZ (ELECTRODE) IMPLANT
CHLORAPREP W/TINT 26 (MISCELLANEOUS) ×4 IMPLANT
CLIP APPLIE ROT 13.4 12 LRG (CLIP) IMPLANT
CLIP SUT LAPRA TY ABSORB (SUTURE) ×4 IMPLANT
COVER SURGICAL LIGHT HANDLE (MISCELLANEOUS) ×2 IMPLANT
COVER WAND RF STERILE (DRAPES) IMPLANT
DEVICE SUT QUICK LOAD TK 5 (STAPLE) IMPLANT
DEVICE SUT TI-KNOT TK 5X26 (MISCELLANEOUS) IMPLANT
DEVICE SUTURE ENDOST 10MM (ENDOMECHANICALS) ×2 IMPLANT
DRAIN PENROSE 0.25X18 (DRAIN) ×2 IMPLANT
ELECT REM PT RETURN 15FT ADLT (MISCELLANEOUS) ×2 IMPLANT
GAUZE 4X4 16PLY RFD (DISPOSABLE) ×2 IMPLANT
GAUZE SPONGE 4X4 12PLY STRL (GAUZE/BANDAGES/DRESSINGS) ×2 IMPLANT
GLOVE BIO SURGEON STRL SZ 6 (GLOVE) ×2 IMPLANT
GLOVE BIO SURGEON STRL SZ 6.5 (GLOVE) ×2 IMPLANT
GLOVE BIO SURGEON STRL SZ7.5 (GLOVE) ×2 IMPLANT
GLOVE BIOGEL PI IND STRL 6.5 (GLOVE) ×1 IMPLANT
GLOVE BIOGEL PI IND STRL 7.5 (GLOVE) ×2 IMPLANT
GLOVE BIOGEL PI INDICATOR 6.5 (GLOVE) ×1
GLOVE BIOGEL PI INDICATOR 7.5 (GLOVE) ×2
GLOVE INDICATOR 6.5 STRL GRN (GLOVE) ×2 IMPLANT
GLOVE INDICATOR 8.0 STRL GRN (GLOVE) ×2 IMPLANT
GLOVE SURG SS PI 7.0 STRL IVOR (GLOVE) ×4 IMPLANT
GOWN STRL REUS W/TWL LRG LVL3 (GOWN DISPOSABLE) ×4 IMPLANT
GOWN STRL REUS W/TWL XL LVL3 (GOWN DISPOSABLE) ×4 IMPLANT
HOVERMATT SINGLE USE (MISCELLANEOUS) ×2 IMPLANT
KIT BASIN (CUSTOM PROCEDURE TRAY) ×2 IMPLANT
KIT GASTRIC LAVAGE 34FR ADT (SET/KITS/TRAYS/PACK) ×2 IMPLANT
KIT TURNOVER KIT A (KITS) IMPLANT
MARKER SKIN DUAL TIP RULER LAB (MISCELLANEOUS) ×2 IMPLANT
NEEDLE SPNL 22GX3.5 QUINCKE BK (NEEDLE) ×2 IMPLANT
PACK CARDIOVASCULAR III (CUSTOM PROCEDURE TRAY) ×2 IMPLANT
PENCIL SMOKE EVACUATOR (MISCELLANEOUS) IMPLANT
RELOAD ENDO STITCH 2.0 (ENDOMECHANICALS) ×9
RELOAD STAPLER BLUE 60MM (STAPLE) ×7 IMPLANT
RELOAD STAPLER GOLD 60MM (STAPLE) IMPLANT
RELOAD STAPLER GREEN 60MM (STAPLE) IMPLANT
RELOAD STAPLER WHITE 60MM (STAPLE) ×2 IMPLANT
SCISSORS LAP 5X45 EPIX DISP (ENDOMECHANICALS) ×2 IMPLANT
SET IRRIG TUBING LAPAROSCOPIC (IRRIGATION / IRRIGATOR) ×2 IMPLANT
SET TUBE SMOKE EVAC HIGH FLOW (TUBING) ×2 IMPLANT
SHEARS HARMONIC ACE PLUS 45CM (MISCELLANEOUS) ×2 IMPLANT
SLEEVE XCEL OPT CAN 5 100 (ENDOMECHANICALS) ×4 IMPLANT
SOL ANTI FOG 6CC (MISCELLANEOUS) ×1 IMPLANT
SOLUTION ANTI FOG 6CC (MISCELLANEOUS) ×1
STAPLER ECHELON BIOABSB 60 FLE (MISCELLANEOUS) IMPLANT
STAPLER ECHELON LONG 60 440 (INSTRUMENTS) ×2 IMPLANT
STAPLER RELOAD BLUE 60MM (STAPLE) ×14
STAPLER RELOAD GOLD 60MM (STAPLE)
STAPLER RELOAD GREEN 60MM (STAPLE)
STAPLER RELOAD WHITE 60MM (STAPLE) ×4
STRIP CLOSURE SKIN 1/2X4 (GAUZE/BANDAGES/DRESSINGS) ×2 IMPLANT
SUT MNCRL AB 4-0 PS2 18 (SUTURE) ×2 IMPLANT
SUT RELOAD ENDO STITCH 2 48X1 (ENDOMECHANICALS) ×5
SUT RELOAD ENDO STITCH 2.0 (ENDOMECHANICALS) ×4
SUT SURGIDAC NAB ES-9 0 48 120 (SUTURE) IMPLANT
SUT VIC AB 2-0 SH 27 (SUTURE) ×1
SUT VIC AB 2-0 SH 27X BRD (SUTURE) ×1 IMPLANT
SUTURE RELOAD END STTCH 2 48X1 (ENDOMECHANICALS) ×5 IMPLANT
SUTURE RELOAD ENDO STITCH 2.0 (ENDOMECHANICALS) ×4 IMPLANT
SYR 10ML ECCENTRIC (SYRINGE) ×2 IMPLANT
SYR 20ML LL LF (SYRINGE) ×4 IMPLANT
SYR 50ML LL SCALE MARK (SYRINGE) ×2 IMPLANT
TOWEL OR 17X26 10 PK STRL BLUE (TOWEL DISPOSABLE) ×2 IMPLANT
TOWEL OR NON WOVEN STRL DISP B (DISPOSABLE) ×2 IMPLANT
TRAY FOLEY MTR SLVR 16FR STAT (SET/KITS/TRAYS/PACK) ×2 IMPLANT
TROCAR BLADELESS OPT 5 100 (ENDOMECHANICALS) ×2 IMPLANT
TROCAR UNIVERSAL OPT 12M 100M (ENDOMECHANICALS) ×2 IMPLANT
TROCAR XCEL 12X100 BLDLESS (ENDOMECHANICALS) ×2 IMPLANT
TUBING CONNECTING 10 (TUBING) ×4 IMPLANT

## 2019-08-18 NOTE — Anesthesia Procedure Notes (Signed)
Procedure Name: Intubation Date/Time: 08/18/2019 9:41 AM Performed by: Anne Fu, CRNA Pre-anesthesia Checklist: Patient identified, Emergency Drugs available, Suction available, Patient being monitored and Timeout performed Patient Re-evaluated:Patient Re-evaluated prior to induction Oxygen Delivery Method: Circle system utilized Preoxygenation: Pre-oxygenation with 100% oxygen Induction Type: IV induction Ventilation: Mask ventilation without difficulty Laryngoscope Size: Mac and 4 Grade View: Grade I Tube type: Oral Tube size: 7.5 mm Number of attempts: 1 Airway Equipment and Method: Stylet Placement Confirmation: ETT inserted through vocal cords under direct vision,  positive ETCO2 and breath sounds checked- equal and bilateral Secured at: 24 cm Tube secured with: Tape Dental Injury: Teeth and Oropharynx as per pre-operative assessment

## 2019-08-18 NOTE — Interval H&P Note (Signed)
History and Physical Interval Note:  08/18/2019 7:49 AM  Derek Prince  has presented today for surgery, with the diagnosis of Morbid Obesity, DM II, Hypothryroidism.  The various methods of treatment have been discussed with the patient and family. After consideration of risks, benefits and other options for treatment, the patient has consented to  Procedure(s): LAPAROSCOPIC ROUX-EN-Y GASTRIC BYPASS WITH UPPER ENDOSCOPY, ERAS Pathway (N/A) as a surgical intervention.  The patient's history has been reviewed, patient examined, no change in status, stable for surgery.  I have reviewed the patient's chart and labs.  Questions were answered to the patient's satisfaction.     Fabiana Dromgoole Lollie Sails

## 2019-08-18 NOTE — Transfer of Care (Signed)
Immediate Anesthesia Transfer of Care Note  Patient: Derek Prince  Procedure(s) Performed: Procedure(s): LAPAROSCOPIC ROUX-EN-Y GASTRIC BYPASS WITH UPPER ENDOSCOPY, ERAS Pathway (N/A)  Patient Location: PACU  Anesthesia Type:General  Level of Consciousness:  sedated, patient cooperative and responds to stimulation  Airway & Oxygen Therapy:Patient Spontanous Breathing and Patient connected to face mask oxgen  Post-op Assessment:  Report given to PACU RN and Post -op Vital signs reviewed and stable  Post vital signs:  Reviewed and stable  Last Vitals:  Vitals:   08/18/19 0756 08/18/19 1246  BP: (!) 163/97 (!) 157/98  Pulse: 87 94  Resp: 18 15  Temp: 37.1 C   SpO2: 97% 94%    Complications: No apparent anesthesia complications

## 2019-08-18 NOTE — Op Note (Signed)
Derek Prince [id number] 02-01-1976 08/18/2019  Preoperative diagnosis: roux en Y gastric bypass in progress  Postoperative diagnosis: Same   Procedure: Upper endoscopy   Surgeon: Susy Frizzle B. Daphine Deutscher  M.D., FACS   Anesthesia: Gen.   Indications for procedure: This patient was undergoing a gastric bypass by Dr. Fredricka Bonine.    Description of procedure: The endoscopy was placed in the mouth and into the oropharynx and under endoscopic vision it was advanced to the esophagogastric junction.  The pouch was insufflated and was ~ 6 cm in length and he had a patent anastomosis.  .   No bleeding or leaks were detected.  The scope was withdrawn without difficulty.     Matt B. Daphine Deutscher, MD, FACS General, Bariatric, & Minimally Invasive Surgery [facility], [place]

## 2019-08-18 NOTE — Progress Notes (Signed)
PHARMACY CONSULT FOR:  Risk Assessment for Post-Discharge VTE Following Bariatric Surgery  Post-Discharge VTE Risk Assessment: This patient's probability of 30-day post-discharge VTE is increased due to the factors marked: x  Male    Age >/=60 years    BMI >/=50 kg/m2    CHF    Dyspnea at Rest    Paraplegia   x Non-gastric-band surgery    Operation Time >/=3 hr    Return to OR     Length of Stay >/= 3 d      Hx of VTE   Hypercoagulable condition   Significant venous stasis   Predicted probability of 30-day post-discharge VTE: 0.31% Other patient-specific factors to consider:   Recommendation for Discharge: No pharmacologic prophylaxis post-discharge    Derek Prince is a 44 y.o. male who underwent  Roux-en-Y gastric bypass 08/18/19   Case start: 1001 Case end: 1234   Allergies  Allergen Reactions  . Xigduo Xr [Dapagliflozin-Metformin Hcl Er] Other (See Comments)    FOOT ULCERS     Patient Measurements: Height: 6\' 3"  (190.5 cm) Weight: (!) 154.2 kg (340 lb) IBW/kg (Calculated) : 84.5 Body mass index is 42.5 kg/m.  No results for input(s): WBC, HGB, HCT, PLT, APTT, CREATININE, LABCREA, CREATININE, CREAT24HRUR, MG, PHOS, ALBUMIN, PROT, ALBUMIN, AST, ALT, ALKPHOS, BILITOT, BILIDIR, IBILI in the last 72 hours. Estimated Creatinine Clearance: 172.3 mL/min (by C-G formula based on SCr of 0.87 mg/dL).    Past Medical History:  Diagnosis Date  . Depression   . Diabetes mellitus without complication (HCC)   . Hyperlipidemia   . Hypertension   . Hypothyroidism   . Thyroid disease      Medications Prior to Admission  Medication Sig Dispense Refill Last Dose  . levothyroxine (SYNTHROID, LEVOTHROID) 112 MCG tablet Take 224 mcg by mouth daily.   08/18/2019 at 0300  . metFORMIN (GLUCOPHAGE-XR) 500 MG 24 hr tablet Take 500 mg by mouth in the morning and at bedtime.   08/17/2019 at Unknown time  . NOVOLOG MIX 70/30 FLEXPEN (70-30) 100 UNIT/ML FlexPen Inject 60-150 Units  into the skin See admin instructions. SLIDING SCALE INSULIN PER DEXCOM G6 CONTINOUS GLUCOSE MONITORING SYSTEM   08/17/2019 at Unknown time  . VYVANSE 70 MG capsule Take 70 mg by mouth every Monday, Tuesday, Wednesday, Thursday, and Friday.    Past Week at Unknown time       Saturday RPh 08/18/2019, 1:43 PM

## 2019-08-18 NOTE — Op Note (Signed)
Operative Note  Boubacar Lerette  [id number]  [id number]  08/18/2019   Surgeon: [name] MD   Assistant: Gaynelle Adu MD and Wenda Low MD   Procedure performed: laparoscopic Roux-en-Y gastric bypass ( antecolic, antegastric) , upper endoscopy   Preop diagnosis: Morbid obesity Body mass index is 42.5 kg/m., diabetes, hypertension, hyperlipidemia, hypothyroidism, depression Post-op diagnosis/intraop findings: same   Specimens: none Retained items: none  EBL: minimal cc Complications: none   Description of procedure: After obtaining informed consent and administration of prophylactic heparin in holding, the patient was taken to the operating room and placed supine on operating room table where general endotracheal anesthesia was initiated, preoperative antibiotics were administered, SCDs applied, and a formal timeout was performed. The abdomen was prepped and draped in usual sterile fashion. Peritoneal access was gained using a Visiport technique in the left upper quadrant and insufflation to 15 mmHg ensued without issue. Gross inspection revealed no evidence of injury. Under direct visualization the remaining trochars were inserted. A laparoscopic assisted bilateral taps block was performed using Exparel mixed with Marcaine. Omental adhesions to the old mesh were taken down with harmonic and blunt dissection.   The omentum was reflected cephalad and the ligament of Treitz identified. The small bowel was followed to a point 40 cm distal to ligament of Treitz at which location the bowel was divided with a white load linear cutting stapler. A Penrose was sutured to the Roux side of the staple line for future identification. The bowel was measured another 100 cm distal to this and and the site for the jejunojejunostomy was aligned with the end of the biliopancreatic limb. Enterotomies were made with the Harmonic scalpel and the anastomosis was created with the 60 mm white load linear cutting  stapler. The common enterotomy was closed with running 3-0 Vicryl starting on either end and tying centrally. The mesenteric defect was closed with running silk suture secured with Lapra-Ty's. The anastomosis was inspected and appeared widely patent, hemostatic with no gaps in the suture line. Vistaseal was injected over the anastomosis. We then divided the omentum using the harmonic scalpel.   The patient was then placed in steep reverse Trendelenburg. The liver retractor was inserted through a subxiphoid incision and secured for fixed retraction of the left lobe. The Harmonic scalpel was used to enter the perigastric plane and the lesser sac at a point 6 cm distal to the GE junction on the lesser curve. The angle of His was gently bluntly dissected in the target shape of the pouch visualized to exclude any residual fundus. This was very difficult due to increased fat infiltration of the posterior stomach. After confirming that all tubes have been removed from the stomach, the gastric pouch was created with serial fires of the linear cutting stapler. As we approached the angle of His, the Ewald tube was inserted to confirm no impingement on the GE junction.   The Roux limb with its attached Penrose drain was then identified and brought up to meet the gastric pouch ensuring no twist in the small bowel mesentery. The staple line of the small bowel is directed to the patient's left side. A running 3-0 Vicryl was used to create a posterior suture line for our anastomosis between the gastric pouch and the small bowel. Gastrotomy and enterotomy was made with the Harmonic scalpel and a blue load linear cutting stapler was used to create a gastrojejunal anastomosis approximately 2.5cm wide. The common enterotomy was closed with running 3-0 Vicryl starting at  either end and tying centrally. At this juncture the Ewald tube was passed through the gastrojejunal anastomosis. An anterior layer of running 3-0 Vicryl was used to  complete the gastrojejunal anastomosis. The ewald tube was removed without difficulty. The McClure space was closed with a figure-of-eight silk suture. At this point Dr. Hassell Done performed an upper endoscopy with the Roux limb gently clamped with a bowel clamp. Irrigation is instilled in the upper abdomen for a leak test. Please see a separate operative note- the anastomosis is noted to be patent and hemostatic without any leak or bubbles present. The pouch measures 6cm in length. The endoscope was removed and the abdomen once again surveyed. The liver retractor was removed under direct visualization. The abdomen was then desufflated and all remaining trochars removed. The skin incisions were closed with subcuticular Monocryl; benzoin, Steri-Strips and Band-Aids were applied The patient was then awakened, extubated and taken to PACU in stable condition.     All counts were correct at the completion of the case.

## 2019-08-18 NOTE — Progress Notes (Addendum)
Patient alert and oriented, operative day.  Provided support and encouragement.  Encouraged incentive spirometer, ambulation and small sips of liquids.  Pt tolerating water well w/o nausea. Pt up in hallway.  Pt given bariatric surgery post op food plan.  All questions answered.  Will continue to monitor.

## 2019-08-18 NOTE — Anesthesia Preprocedure Evaluation (Addendum)
Anesthesia Evaluation  Patient identified by MRN, date of birth, ID band Patient awake    Reviewed: Allergy & Precautions, H&P , NPO status , Patient's Chart, lab work & pertinent test results, reviewed documented beta blocker date and time   Airway Mallampati: II  TM Distance: >3 FB Neck ROM: full    Dental no notable dental hx. (+) Teeth Intact, Dental Advisory Given, Chipped,    Pulmonary neg pulmonary ROS, former smoker,    Pulmonary exam normal breath sounds clear to auscultation       Cardiovascular Exercise Tolerance: Good hypertension, negative cardio ROS   Rhythm:regular Rate:Normal     Neuro/Psych PSYCHIATRIC DISORDERS Depression negative neurological ROS     GI/Hepatic negative GI ROS, Neg liver ROS,   Endo/Other  diabetes, Type 2Hypothyroidism Morbid obesity  Renal/GU negative Renal ROS  negative genitourinary   Musculoskeletal   Abdominal (+) + obese,   Peds  Hematology negative hematology ROS (+)   Anesthesia Other Findings   Reproductive/Obstetrics negative OB ROS                            Anesthesia Physical Anesthesia Plan  ASA: III  Anesthesia Plan: General   Post-op Pain Management:    Induction: Intravenous  PONV Risk Score and Plan: 2 and Ondansetron and Treatment may vary due to age or medical condition  Airway Management Planned: Oral ETT  Additional Equipment:   Intra-op Plan:   Post-operative Plan: Extubation in OR  Informed Consent: I have reviewed the patients History and Physical, chart, labs and discussed the procedure including the risks, benefits and alternatives for the proposed anesthesia with the patient or authorized representative who has indicated his/her understanding and acceptance.     Dental Advisory Given  Plan Discussed with: CRNA and Anesthesiologist  Anesthesia Plan Comments: (  )       Anesthesia Quick  Evaluation

## 2019-08-18 NOTE — Progress Notes (Signed)
Inpatient Diabetes Program Recommendations  AACE/ADA: New Consensus Statement on Inpatient Glycemic Control (2015)  Target Ranges:  Prepandial:   less than 140 mg/dL      Peak postprandial:   less than 180 mg/dL (1-2 hours)      Critically ill patients:  140 - 180 mg/dL   Lab Results  Component Value Date   GLUCAP 109 (H) 08/18/2019    Review of Glycemic Control  Diabetes history: DM2 Outpatient Diabetes medications: Novolog 70/30 60-150 units bid (has Dexcom CGM), metformin 500 mg bid Current orders for Inpatient glycemic control: None at present (in surgery)   Inpatient Diabetes Program Recommendations:     Novolog 0-20 units Q4H HgbA1C to assess glycemic control PTA  Will likely not need 70/30 insulin at home, although pt should check blood sugars at least 4x/day and call MD if CBGs > 200 mg/dL on a regular basis.  Continue to follow.  Thank you. Derek Prince, RD, LDN, CDE Inpatient Diabetes Coordinator [phone]

## 2019-08-19 ENCOUNTER — Encounter: Payer: Self-pay | Admitting: *Deleted

## 2019-08-19 LAB — CBC WITH DIFFERENTIAL/PLATELET
Abs Immature Granulocytes: 0.1 10*3/uL — ABNORMAL HIGH (ref 0.00–0.07)
Basophils Absolute: 0 10*3/uL (ref 0.0–0.1)
Basophils Relative: 0 %
Eosinophils Absolute: 0.1 10*3/uL (ref 0.0–0.5)
Eosinophils Relative: 1 %
HCT: 41.9 % (ref 39.0–52.0)
Hemoglobin: 13.6 g/dL (ref 13.0–17.0)
Immature Granulocytes: 1 %
Lymphocytes Relative: 14 %
Lymphs Abs: 2 10*3/uL (ref 0.7–4.0)
MCH: 26.9 pg (ref 26.0–34.0)
MCHC: 32.5 g/dL (ref 30.0–36.0)
MCV: 82.8 fL (ref 80.0–100.0)
Monocytes Absolute: 1 10*3/uL (ref 0.1–1.0)
Monocytes Relative: 7 %
Neutro Abs: 11.4 10*3/uL — ABNORMAL HIGH (ref 1.7–7.7)
Neutrophils Relative %: 77 %
Platelets: 323 10*3/uL (ref 150–400)
RBC: 5.06 MIL/uL (ref 4.22–5.81)
RDW: 13.3 % (ref 11.5–15.5)
WBC: 14.7 10*3/uL — ABNORMAL HIGH (ref 4.0–10.5)
nRBC: 0 % (ref 0.0–0.2)

## 2019-08-19 LAB — COMPREHENSIVE METABOLIC PANEL
ALT: 50 U/L — ABNORMAL HIGH (ref 0–44)
AST: 49 U/L — ABNORMAL HIGH (ref 15–41)
Albumin: 3.3 g/dL — ABNORMAL LOW (ref 3.5–5.0)
Alkaline Phosphatase: 61 U/L (ref 38–126)
Anion gap: 10 (ref 5–15)
BUN: 13 mg/dL (ref 6–20)
CO2: 26 mmol/L (ref 22–32)
Calcium: 8.6 mg/dL — ABNORMAL LOW (ref 8.9–10.3)
Chloride: 99 mmol/L (ref 98–111)
Creatinine, Ser: 0.73 mg/dL (ref 0.61–1.24)
GFR calc Af Amer: >60 mL/min (ref >60)
GFR calc non Af Amer: >60 mL/min (ref >60)
Glucose, Bld: 187 mg/dL — ABNORMAL HIGH (ref 70–99)
Potassium: 4.4 mmol/L (ref 3.5–5.1)
Sodium: 135 mmol/L (ref 135–145)
Total Bilirubin: 0.5 mg/dL (ref 0.3–1.2)
Total Protein: 6.9 g/dL (ref 6.5–8.1)

## 2019-08-19 LAB — MAGNESIUM: Magnesium: 2.1 mg/dL (ref 1.7–2.4)

## 2019-08-19 LAB — GLUCOSE, CAPILLARY
Glucose-Capillary: 191 mg/dL — ABNORMAL HIGH (ref 70–99)
Glucose-Capillary: 179 mg/dL — ABNORMAL HIGH (ref 70–99)
Glucose-Capillary: 173 mg/dL — ABNORMAL HIGH (ref 70–99)

## 2019-08-19 MED ORDER — ONDANSETRON 4 MG PO TBDP
4.0000 mg | ORAL_TABLET | Freq: Four times a day (QID) | ORAL | 0 refills | Status: DC | PRN
Start: 2019-08-19 — End: 2023-12-30

## 2019-08-19 MED ORDER — DOCUSATE SODIUM 100 MG PO CAPS: 100.0000 mg | ORAL_CAPSULE | Freq: Two times a day (BID) | ORAL | 0 refills | Status: DC

## 2019-08-19 MED ORDER — TRAMADOL HCL 50 MG PO TABS
50.0000 mg | ORAL_TABLET | Freq: Four times a day (QID) | ORAL | 0 refills | Status: DC | PRN
Start: 2019-08-19 — End: 2023-12-30

## 2019-08-19 MED ORDER — ACETAMINOPHEN 500 MG PO TABS: 1000.0000 mg | ORAL_TABLET | Freq: Three times a day (TID) | ORAL | 0 refills | Status: AC

## 2019-08-19 MED ORDER — GABAPENTIN 100 MG PO CAPS: 200.0000 mg | ORAL_CAPSULE | Freq: Two times a day (BID) | ORAL | 0 refills | Status: DC

## 2019-08-19 MED ORDER — INSULIN ASPART 100 UNIT/ML ~~LOC~~ SOLN: 0.0000 [IU] | Freq: Three times a day (TID) | SUBCUTANEOUS | 11 refills | Status: DC

## 2019-08-19 MED ORDER — PANTOPRAZOLE SODIUM 40 MG PO TBEC
40.0000 mg | DELAYED_RELEASE_TABLET | Freq: Every day | ORAL | 0 refills | Status: DC
Start: 2019-08-19 — End: 2023-12-30

## 2019-08-19 NOTE — Progress Notes (Signed)
Inpatient Diabetes Program Recommendations  AACE/ADA: New Consensus Statement on Inpatient Glycemic Control (2015)  Target Ranges:  Prepandial:   less than 140 mg/dL      Peak postprandial:   less than 180 mg/dL (1-2 hours)      Critically ill patients:  140 - 180 mg/dL   Lab Results  Component Value Date   GLUCAP 173 (H) 08/19/2019    Review of Glycemic Control  191, 179, 173 mg/dL thus far today.  Spoke with pt at bedside regarding his glucose control. Has f/u appt with Dr Horald Pollen (Endo) next week. Will restart CGM Dexcom when he gets home. Discussed Novolog 0-20 units tidwc and hs at home. Pt states he has taken Novolog in the past. Answered questions.  Discussed with Bariatric RN.  Thank you. Ailene Ards, RD, LDN, CDE Inpatient Diabetes Coordinator [phone]

## 2019-08-19 NOTE — Plan of Care (Signed)
Patient was given discharge instructions and prescriptions. Patients questions were answered and he was taken to the main entrance via wheelchair.

## 2019-08-19 NOTE — Progress Notes (Addendum)
S: Denies pain, nausea, or dysphagia. Walked 4 times last night and tolerating liquids/ protein shakes. Did have some epigastric discomfort when he tried to drink a second 2oz protein shake quickly after the first.   Vitals, labs, intake/output, and orders reviewed at this time. Tmax 98.7, HR 76, hypertensive; sat 99% on room air. PO 720, UOP 2575. Labs not collected yet.    Gen: A&Ox3, no distress, sitting up on side of bed Chest: unlabored respirations, RRR Abd: soft, nontender, nondistended, incisions c/d/i with steri strips; no cellulitis or hematoma Ext: warm, stable R ankle edema from gout flare Neuro: grossly normal  Lines/tubes/drains: PIV  A/P: 44yo man with history of morbid obesity, diabetes, hypertension, hyperlipidemia, hypothyroidism, depression; POD 1 Roux en y gastric bypass, doing well. Continue clears, protein shakes Continue ambulation, SCDs, lovenox, pulm toilet DC dilaudid and oxycodone from [facility] Await morning labs  If labs acceptable and continues to do well, possible discharge later this afternoon. Likely will DC on sliding scale only.  Will need close follow up with PCP for titration of medications for comorbidities.     Phylliss Blakes, MD [facility] Surgery, [place] Pager [phone]

## 2019-08-19 NOTE — Discharge Instructions (Signed)
° ° ° °GASTRIC BYPASS/SLEEVE ° Home Care Instructions ° ° These instructions are to help you care for yourself when you go home. ° °Call: If you have any problems. °• Call [phone] and ask for the surgeon on call °• If you need immediate help, come to the ER at [facility].  °• Tell the ER staff that you are a new post-op gastric bypass or gastric sleeve patient °  °Signs and symptoms to report: • Severe vomiting or nausea °o If you cannot keep down clear liquids for longer than 1 day, call your surgeon  °• Abdominal pain that does not get better after taking your pain medication °• Fever over 100.4° F with chills °• Heart beating over 100 beats a minute °• Shortness of breath at rest °• Chest pain °•  Redness, swelling, drainage, or foul odor at incision (surgical) sites °•  If your incisions open or pull apart °• Swelling or pain in calf (lower leg) °• Diarrhea (Loose bowel movements that happen often), frequent watery, uncontrolled bowel movements °• Constipation, (no bowel movements for 3 days) if this happens: Pick one °o Milk of Magnesia, 2 tablespoons by mouth, 3 times a day for 2 days if needed °o Stop taking Milk of Magnesia once you have a bowel movement °o Call your doctor if constipation continues °Or °o Miralax  (instead of Milk of Magnesia) following the label instructions °o Stop taking Miralax once you have a bowel movement °o Call your doctor if constipation continues °• Anything you think is not normal °  °Normal side effects after surgery: • Unable to sleep at night or unable to focus °• Irritability or moody °• Being tearful (crying) or depressed °These are common complaints, possibly related to your anesthesia medications that put you to sleep, stress of surgery, and change in lifestyle.  This usually goes away a few weeks after surgery.  If these feelings continue, call your primary care doctor. °  °Wound Care: You may have surgical glue, steri-strips, or staples over your incisions after  surgery °• Surgical glue:  Looks like a clear film over your incisions and will wear off a little at a time °• Steri-strips: Strips of tape over your incisions. You may notice a yellowish color on the skin under the steri-strips. This is used to make the   steri-strips stick better. Do not pull the steri-strips off - let them fall off °• Staples: Staples may be removed before you leave the hospital °o If you go home with staples, call [facility], [phone] at for an appointment with your surgeon’s nurse to have staples removed 10 days after surgery. °• Showering: You may shower two (2) days after your surgery unless your surgeon tells you differently °o Wash gently around incisions with warm soapy water, rinse well, and gently pat dry  °o No tub baths until staples are removed, steri-strips fall off or glue is gone.  °  °Medications: • Medications should be liquid or crushed if larger than the size of a dime °• Extended release pills (medication that release a little bit at a time through the day) should NOT be crushed or cut. (examples include XL, ER, DR, SR) °• Depending on the size and number of medications you take, you may need to space (take a few throughout the day)/change the time you take your medications so that you do not over-fill your pouch (smaller stomach) °• Make sure you follow-up with your primary care doctor to   make medication changes needed during rapid weight loss and life-style changes °• If you have diabetes, follow up with the doctor that orders your diabetes medication(s) within one week after surgery and check your blood sugar regularly. °• Do not drive while taking prescription pain medication  °• It is ok to take Tylenol by the bottle instructions with your pain medicine or instead of your pain medicine as needed.  DO NOT TAKE NSAIDS (EXAMPLES OF NSAIDS:  IBUPROFREN/ NAPROXEN)  °Diet:                    First 2 Weeks ° You will see the dietician t about two (2) weeks  after your surgery. The dietician will increase the types of foods you can eat if you are handling liquids well: °• If you have severe vomiting or nausea and cannot keep down clear liquids lasting longer than 1 day, call your surgeon @ ([phone]) °Protein Shake °• Drink at least 2 ounces of shake 5-6 times per day °• Each serving of protein shakes (usually 8 - 12 ounces) should have: °o 15 grams of protein  °o And no more than 5 grams of carbohydrate  °• Goal for protein each day: °o Men = 80 grams per day °o Women = 60 grams per day °• Protein powder may be added to fluids such as non-fat milk or Lactaid milk or unsweetened Soy/Almond milk (limit to 35 grams added protein powder per serving) ° °Hydration °• Slowly increase the amount of water and other clear liquids as tolerated (See Acceptable Fluids) °• Slowly increase the amount of protein shake as tolerated  °•  Sip fluids slowly and throughout the day.  Do not use straws. °• May use sugar substitutes in small amounts (no more than 6 - 8 packets per day; i.e. Splenda) ° °Fluid Goal °• The first goal is to drink at least 8 ounces of protein shake/drink per day (or as directed by the nutritionist); some examples of protein shakes are Syntrax Nectar, Adkins Advantage, EAS Edge HP, and Unjury. See handout from pre-op Bariatric Education Class: °o Slowly increase the amount of protein shake you drink as tolerated °o You may find it easier to slowly sip shakes throughout the day °o It is important to get your proteins in first °• Your fluid goal is to drink 64 - 100 ounces of fluid daily °o It may take a few weeks to build up to this °• 32 oz (or more) should be clear liquids  °And  °• 32 oz (or more) should be full liquids (see below for examples) °• Liquids should not contain sugar, caffeine, or carbonation ° °Clear Liquids: °• Water or Sugar-free flavored water (i.e. Fruit H2O, Propel) °• Decaffeinated coffee or tea (sugar-free) °• Crystal Lite, Wyler’s Lite,  Minute Maid Lite °• Sugar-free Jell-O °• Bouillon or broth °• Sugar-free Popsicle:   *Less than 20 calories each; Limit 1 per day ° °Full Liquids: °Protein Shakes/Drinks + 2 choices per day of other full liquids °• Full liquids must be: °o No More Than 15 grams of Carbs per serving  °o No More Than 3 grams of Fat per serving °• Strained low-fat cream soup (except Cream of Potato or Tomato) °• Non-Fat milk °• Fat-free Lactaid Milk °• Unsweetened Soy Or Unsweetened Almond Milk °• Low Sugar yogurt (Dannon Lite & Fit, Greek yogurt; Oikos Triple Zero; Chobani Simply 100; Yoplait 100 calorie Greek - No Fruit on the Bottom) ° °  °Vitamins   and Minerals • Start 1 day after surgery unless otherwise directed by your surgeon °• 2 Chewable Bariatric Specific Multivitamin / Multimineral Supplement with iron (Example: Bariatric Advantage Multi EA) °• Chewable Calcium with Vitamin D-3 °(Example: 3 Chewable Calcium Plus 600 with Vitamin D-3) °o Take 500 mg three (3) times a day for a total of 1500 mg each day °o Do not take all 3 doses of calcium at one time as it may cause constipation, and you can only absorb 500 mg  at a time  °o Do not mix multivitamins containing iron with calcium supplements; take 2 hours apart °• Menstruating women and those with a history of anemia (a blood disease that causes weakness) may need extra iron °o Talk with your doctor to see if you need more iron °• Do not stop taking or change any vitamins or minerals until you talk to your dietitian or surgeon °• Your Dietitian and/or surgeon must approve all vitamin and mineral supplements °  °Activity and Exercise: Limit your physical activity as instructed by your doctor.  It is important to continue walking at home.  During this time, use these guidelines: °• Do not lift anything greater than ten (10) pounds for at least two (2) weeks °• Do not go back to work or drive until your surgeon says you can °• You may have sex when you feel comfortable  °o It is  VERY important for male patients to use a reliable birth control method; fertility often increases after surgery  °o All hormonal birth control will be ineffective for 30 days after surgery due to medications given during surgery a barrier method must be used. °o Do not get pregnant for at least 18 months °• Start exercising as soon as your doctor tells you that you can °o Make sure your doctor approves any physical activity °• Start with a simple walking program °• Walk 5-15 minutes each day, 7 days per week.  °• Slowly increase until you are walking 30-45 minutes per day °Consider joining our BELT program. [phone] or email [email or web address] °  °Special Instructions Things to remember: °• Use your CPAP when sleeping if this applies to you ° °• [facility] has two free Bariatric Surgery Support Groups that meet monthly °o The 3rd Thursday of each month, 6 pm, [facility]  °o The 2nd Friday of each month, 11:45 am in the private dining room in the basement of [place] °• It is very important to keep all follow up appointments with your surgeon, dietitian, primary care physician, and behavioral health practitioner °• Routine follow up schedule with your surgeon include appointments at 2-3 weeks, 6-8 weeks, 6 months, and 1 year at a minimum.  Your surgeon may request to see you more often.   °o After the first year, please follow up with your bariatric surgeon and dietitian at least once a year in order to maintain best weight loss results °[facility]: [phone] °[facility]: [phone] °Bariatric Nurse Coordinator: [phone] °  °   Reviewed and Endorsed  °by [facility] Patient Education Committee, June, 2016 °Edits Approved: Aug, 2018 ° ° ° °

## 2019-08-19 NOTE — Progress Notes (Signed)
Patient alert and oriented, pain is controlled. Patient is tolerating fluids, advanced to protein shake yesterday, patient is tolerating well. Reviewed Gastric Bypass discharge instructions with patient and patient is able to articulate understanding. Provided information on BELT program, Support Group and [facility] outpatient pharmacy. All questions answered, will continue to monitor. Pamala Hurry RN diab coordinator to stop in before discharge.  24 hr po intake= [id number]

## 2019-08-19 NOTE — Discharge Summary (Signed)
Physician Discharge Summary  Derek Prince ONG:[id number] DOB: 12-22-1975 DOA: 08/18/2019  PCP: [name], MD  Admit date: 08/18/2019 Discharge date: 08/19/2019  Recommendations for Outpatient Follow-up:     Follow-up Information     Derek Bue, MD Follow up on 09/09/2019.   Specialty: General Surgery Why: @ 9:20am with [name] information: [address] [place] [address] [phone]         Derek Bue, MD .   Specialty: General Surgery Contact information: [address] [place] [address] [phone]                Discharge Diagnoses:  Active Problems:   Morbid obesity [facility])   Surgical Procedure: Laparoscopic Roux-en-Y gastric bypass, upper endoscopy  Discharge Condition: Good Disposition: Home  Diet recommendation: Postoperative gastric bypass diet  Filed Weights   08/18/19 0756  Weight: (!) 154.2 kg     Hospital Course:  The patient was admitted for a planned laparoscopic Roux-en-Y gastric bypass. Please see operative note. Preoperatively the patient was given 5000 units of subcutaneous heparin for DVT prophylaxis. ERAS protocol was used. Postoperative prophylactic Lovenox dosing was started on the evening of postoperative day 0.  The patient was started on ice chips and water on the evening of POD 0 which they tolerated. On postoperative day 1 The patient's diet was advanced to protein shakes which they also tolerated. The patient was ambulating without difficulty. Their vital signs are stable without fever or tachycardia. The patient had received discharge instructions and counseling. They were deemed stable for discharge.  BP (!) 171/86 (BP Location: Right Arm)   Pulse 80   Temp 98.2 F (36.8 C) (Oral)   Resp 18   Ht 6\' 3"  (1.905 m)   Wt (!) 154.2 kg   SpO2 97%   BMI 42.50 kg/m   See rounding note exam  Discharge Instructions  Discharge Instructions     Ambulate  hourly while awake   Complete by: As directed    Call MD for:  difficulty breathing, headache or visual disturbances   Complete by: As directed    Call MD for:  persistant dizziness or light-headedness   Complete by: As directed    Call MD for:  persistant nausea and vomiting   Complete by: As directed    Call MD for:  redness, tenderness, or signs of infection (pain, swelling, redness, odor or green/yellow discharge around incision site)   Complete by: As directed    Call MD for:  severe uncontrolled pain   Complete by: As directed    Call MD for:  temperature >101 F   Complete by: As directed    Incentive spirometry   Complete by: As directed    Perform hourly while awake      Allergies as of 08/19/2019       Reactions   Xigduo Xr [dapagliflozin-metformin Hcl Er] Other (See Comments)   FOOT ULCERS        Medication List     STOP taking these medications    metFORMIN 500 MG 24 hr tablet Commonly known as: GLUCOPHAGE-XR   NovoLOG Mix 70/30 FlexPen (70-30) 100 UNIT/ML FlexPen Generic drug: insulin aspart protamine - aspart       TAKE these medications    acetaminophen 500 MG tablet Commonly known as: TYLENOL Take 2 tablets (1,000 mg total) by mouth every 8 (eight) hours for 5 days.   docusate sodium 100 MG capsule Commonly  known as: COLACE Take 1 capsule (100 mg total) by mouth 2 (two) times daily. OK to decrease to once daily or stop taking If having loose bowel movements.   gabapentin 100 MG capsule Commonly known as: NEURONTIN Take 2 capsules (200 mg total) by mouth every 12 (twelve) hours.   insulin aspart 100 UNIT/ML injection Commonly known as: novoLOG Inject 0-20 Units into the skin 4 (four) times daily -  with meals and at bedtime. CBG 70-120: 0 units CBG 121-150: 3 units CBG 151-200: 4 units CBG 201-250: 7 units CBG 251-300: 11 units CBG 301-350: 15 units CBG 351-400: 20 units   levothyroxine 112 MCG tablet Commonly known as: SYNTHROID Take  224 mcg by mouth daily.   ondansetron 4 MG disintegrating tablet Commonly known as: ZOFRAN-ODT Take 1 tablet (4 mg total) by mouth every 6 (six) hours as needed for nausea or vomiting.   pantoprazole 40 MG tablet Commonly known as: PROTONIX Take 1 tablet (40 mg total) by mouth daily.   traMADol 50 MG tablet Commonly known as: ULTRAM Take 1 tablet (50 mg total) by mouth every 6 (six) hours as needed (pain).   Vyvanse 70 MG capsule Generic drug: lisdexamfetamine Take 70 mg by mouth every Monday, Tuesday, Wednesday, Thursday, and Friday.        Follow-up Information     Derek Riley, MD Follow up on 09/09/2019.   Specialty: General Surgery Why: @ 9:20am with Dr. Denyce Robert information: [place] [address] [phone]         Derek Riley, MD .   Specialty: General Surgery Contact information: [address] [place] [address] [phone]                  The results of significant diagnostics from this hospitalization (including imaging, microbiology, ancillary and laboratory) are listed below for reference.    Significant Diagnostic Studies: No results found.  Labs: Basic Metabolic Panel: Recent Labs  Lab 08/19/19 0639  NA 135  K 4.4  CL 99  CO2 26  GLUCOSE 187*  BUN 13  CREATININE 0.73  CALCIUM 8.6*  MG 2.1   Liver Function Tests: Recent Labs  Lab 08/19/19 0639  AST 49*  ALT 50*  ALKPHOS 61  BILITOT 0.5  PROT 6.9  ALBUMIN 3.3*    CBC: Recent Labs  Lab 08/19/19 0639  WBC 14.7*  NEUTROABS 11.4*  HGB 13.6  HCT 41.9  MCV 82.8  PLT 323    CBG: Recent Labs  Lab 08/18/19 2000 08/18/19 2356 08/19/19 0407 08/19/19 0732 08/19/19 1129  GLUCAP 260* 227* 191* 179* 173*    Active Problems:   Morbid obesity ([place])    Signed:  [facility], [place] [phone] 08/20/2019, 8:46 AM

## 2019-08-19 NOTE — Anesthesia Postprocedure Evaluation (Signed)
Anesthesia Post Note  Patient: Derek Prince  Procedure(s) Performed: LAPAROSCOPIC ROUX-EN-Y GASTRIC BYPASS WITH UPPER ENDOSCOPY, ERAS Pathway (N/A Abdomen)     Patient location during evaluation: PACU Anesthesia Type: General Level of consciousness: awake and alert Pain management: pain level controlled Vital Signs Assessment: post-procedure vital signs reviewed and stable Respiratory status: spontaneous breathing, nonlabored ventilation, respiratory function stable and patient connected to nasal cannula oxygen Cardiovascular status: blood pressure returned to baseline and stable Postop Assessment: no apparent nausea or vomiting Anesthetic complications: no    Last Vitals:  Vitals:   08/19/19 0153 08/19/19 0515  BP: (!) 146/87 (!) 163/94  Pulse: 77 76  Resp: 16 16  Temp: 36.5 C 36.5 C  SpO2: 96% 99%    Last Pain:  Vitals:   08/19/19 0515  TempSrc: Oral  PainSc:                  Braiden Rodman

## 2019-08-19 NOTE — Progress Notes (Signed)
Nutrition Education Note  Received consult for diet education for patient s/p bariatric surgery.  Discussed 2 week post op diet with pt. Emphasized that liquids must be non carbonated, non caffeinated, and sugar free. Fluid goals discussed. Pt to follow up with outpatient bariatric RD for further diet progression after 2 weeks. Multivitamins and minerals also reviewed. Teach back method used, pt expressed understanding, expect good compliance.   Diet: First 2 Weeks  You will see the dietitian about two (2) weeks after your surgery. The dietitian will increase the types of foods you can eat if you are handling liquids well:  If you have severe vomiting or nausea and cannot handle clear liquids lasting longer than 1 day, call your surgeon  Protein Shake  Drink at least 2 ounces of shake 5-6 times per day  Each serving of protein shakes (usually 8 - 12 ounces) should have a minimum of:  15 grams of protein  And no more than 5 grams of carbohydrate  Goal for protein each day:  Men = 80 grams per day  Women = 60 grams per day  Protein powder may be added to fluids such as non-fat milk or Lactaid milk or Soy milk (limit to 35 grams added protein powder per serving)   Hydration  Slowly increase the amount of water and other clear liquids as tolerated (See Acceptable Fluids)  Slowly increase the amount of protein shake as tolerated  Sip fluids slowly and throughout the day  May use sugar substitutes in small amounts (no more than 6 - 8 packets per day; i.e. Splenda)   Fluid Goal  The first goal is to drink at least 8 ounces of protein shake/drink per day (or as directed by the nutritionist); some examples of protein shakes are Premier Protein, Syntrax Nectar, Adkins Advantage, EAS Edge HP, and Unjury. See handout from pre-op Bariatric Education Class:  Slowly increase the amount of protein shake you drink as tolerated  You may find it easier to slowly sip shakes throughout the day  It is  important to get your proteins in first  Your fluid goal is to drink 64 - 100 ounces of fluid daily  It may take a few weeks to build up to this  32 oz (or more) should be clear liquids  And  32 oz (or more) should be full liquids (see below for examples)  Liquids should not contain sugar, caffeine, or carbonation   Clear Liquids:  Water or Sugar-free flavored water (i.e. Fruit H2O, Propel)  Decaffeinated coffee or tea (sugar-free)  Crystal Lite, Wyler's Lite, Minute Maid Lite  Sugar-free Jell-O  Bouillon or broth  Sugar-free Popsicle: *Less than 20 calories each; Limit 1 per day   Full Liquids:  Protein Shakes/Drinks + 2 choices per day of other full liquids  Full liquids must be:  No More Than 12 grams of Carbs per serving  No More Than 3 grams of Fat per serving  Strained low-fat cream soup  Non-Fat milk  Fat-free Lactaid Milk  Sugar-free yogurt (Dannon Lite & Fit, Greek yogurt, Oikos Zero)   Dalinda Heidt, MS, RD, LDN Inpatient Clinical Dietitian Contact information available via Amion   

## 2019-08-22 DIAGNOSIS — M109 Gout, unspecified: Secondary | ICD-10-CM | POA: Diagnosis not present

## 2019-08-24 ENCOUNTER — Telehealth (HOSPITAL_COMMUNITY): Payer: Self-pay

## 2019-08-24 NOTE — Telephone Encounter (Signed)
Patient called to discuss post bariatric surgery follow up questions.  See below:   1.  Tell me about your pain and pain management?not taking any narcotics but was taking tramadol  2.  Let's talk about fluid intake.  How much total fluid are you taking in?80 ounces of fluid  3.  How much protein have you taken in the last 2 days?90 grams of protein  4.  Have you had nausea?  Tell me about when have experienced nausea and what you did to help? denies  5.  Has the frequency or color changed with your urine?no trouble light urine  6.  Tell me what your incisions look like? No problems  7.  Have you been passing gas? BM?had a couple bms  8.  If a problem or question were to arise who would you call?  Do you know contact numbers for [facility], [facility], and [facility]?aware of how to contact all services  9.  How has the walking going?some difficulty with gout issues  10.  How are your vitamins and calcium going?  How are you taking them?mvi and calcium, no questions   Blood sugars around 140-150, did not getting slide scare insulin as directed at discharge. Discussed the need for glucose control encouraged to reach out to Endo doctor and or utilize sliding scale provided at discharge. Appointment with endo next week

## 2019-08-29 ENCOUNTER — Ambulatory Visit: Payer: BC Managed Care – PPO | Attending: Internal Medicine

## 2019-08-29 DIAGNOSIS — Z23 Encounter for immunization: Secondary | ICD-10-CM

## 2019-08-29 NOTE — Progress Notes (Signed)
   Covid-19 Vaccination Clinic  Name:  Derek Prince    MRN: [id number] DOB: 11-14-1975  08/29/2019  Mr. Derek Prince was observed post Covid-19 immunization for 15 minutes without incident. He was provided with Vaccine Information Sheet and instruction to access the V-Safe system.   Mr. Derek Prince was instructed to call 911 with any severe reactions post vaccine: Marland Kitchen Difficulty breathing  . Swelling of face and throat  . A fast heartbeat  . A bad rash all over body  . Dizziness and weakness   Immunizations Administered    Name Date Dose VIS Date Route   JANSSEN COVID-19 VACCINE 08/29/2019  9:19 AM 0.5 mL 05/09/2019 Intramuscular   Manufacturer: [employer]   Lot: [id number]   NDC: 62194-712-52

## 2019-09-01 ENCOUNTER — Encounter: Payer: BC Managed Care – PPO | Attending: Surgery | Admitting: Skilled Nursing Facility1

## 2019-09-01 ENCOUNTER — Other Ambulatory Visit: Payer: Self-pay

## 2019-09-01 DIAGNOSIS — E669 Obesity, unspecified: Secondary | ICD-10-CM | POA: Insufficient documentation

## 2019-09-01 DIAGNOSIS — E1165 Type 2 diabetes mellitus with hyperglycemia: Secondary | ICD-10-CM | POA: Diagnosis not present

## 2019-09-01 DIAGNOSIS — E1140 Type 2 diabetes mellitus with diabetic neuropathy, unspecified: Secondary | ICD-10-CM | POA: Diagnosis not present

## 2019-09-01 DIAGNOSIS — E039 Hypothyroidism, unspecified: Secondary | ICD-10-CM | POA: Diagnosis not present

## 2019-09-03 NOTE — Progress Notes (Addendum)
2 Week Post-Operative Nutrition Class   Patient was seen on 05/06/18 for Post-Operative Nutrition education at [facility].    Pt began consuming solds before the 2 weeks liquid period was ended. Pt states he has been doing really well eating 3 eggs at a time and eating cheese quesadillas. Dietitian advised him of foods not in this current phase to not consume.  Pt in the middle of gouty flare.   Surgery date: 08/18/2019 Surgery type: RYGB Start weight at [facility]: 341 Weight today: 315.3   Body Composition Scale Declined  Total Body Fat %   Visceral Fat   Fat-Free Mass %    Total Body Water %    Muscle-Mass lbs   Body Fat Displacement          Torso  lbs          Left Leg  lbs          Right Leg  lbs          Left Arm  lbs          Right Arm   lbs      The following the learning objectives were met by the patient during this course:  Identifies Phase 3 (Soft, High Proteins) Dietary Goals and will begin from 2 weeks post-operatively to 2 months post-operatively  Identifies appropriate sources of fluids and proteins   States protein recommendations and appropriate sources post-operatively  Identifies the need for appropriate texture modifications, mastication, and bite sizes when consuming solids  Identifies appropriate multivitamin and calcium sources post-operatively  Describes the need for physical activity post-operatively and will follow MD recommendations  States when to call healthcare provider regarding medication questions or post-operative complications   Handouts given during class include:  Phase 3A: Soft, High Protein Diet Handout   Follow-Up Plan: Patient will follow-up at [facility] in 6 weeks for 2 month post-op nutrition visit for diet advancement per MD.

## 2019-09-07 DIAGNOSIS — M25571 Pain in right ankle and joints of right foot: Secondary | ICD-10-CM | POA: Diagnosis not present

## 2019-09-11 ENCOUNTER — Emergency Department (HOSPITAL_BASED_OUTPATIENT_CLINIC_OR_DEPARTMENT_OTHER)
Admission: EM | Admit: 2019-09-11 | Discharge: 2019-09-11 | Disposition: A | Discharge status: Home / Self Care | Payer: BC Managed Care – PPO | Attending: Emergency Medicine | Admitting: Emergency Medicine

## 2019-09-11 ENCOUNTER — Encounter (HOSPITAL_BASED_OUTPATIENT_CLINIC_OR_DEPARTMENT_OTHER): Payer: Self-pay

## 2019-09-11 ENCOUNTER — Emergency Department (HOSPITAL_BASED_OUTPATIENT_CLINIC_OR_DEPARTMENT_OTHER): Payer: BC Managed Care – PPO

## 2019-09-11 ENCOUNTER — Other Ambulatory Visit: Payer: Self-pay

## 2019-09-11 DIAGNOSIS — R2241 Localized swelling, mass and lump, right lower limb: Secondary | ICD-10-CM | POA: Insufficient documentation

## 2019-09-11 DIAGNOSIS — Z87891 Personal history of nicotine dependence: Secondary | ICD-10-CM | POA: Insufficient documentation

## 2019-09-11 DIAGNOSIS — M25471 Effusion, right ankle: Secondary | ICD-10-CM

## 2019-09-11 DIAGNOSIS — E1165 Type 2 diabetes mellitus with hyperglycemia: Secondary | ICD-10-CM | POA: Diagnosis not present

## 2019-09-11 DIAGNOSIS — I10 Essential (primary) hypertension: Secondary | ICD-10-CM | POA: Diagnosis not present

## 2019-09-11 DIAGNOSIS — M79661 Pain in right lower leg: Secondary | ICD-10-CM | POA: Diagnosis not present

## 2019-09-11 DIAGNOSIS — I82811 Embolism and thrombosis of superficial veins of right lower extremities: Secondary | ICD-10-CM | POA: Diagnosis not present

## 2019-09-11 DIAGNOSIS — I82891 Chronic embolism and thrombosis of other specified veins: Secondary | ICD-10-CM | POA: Diagnosis not present

## 2019-09-11 DIAGNOSIS — I8001 Phlebitis and thrombophlebitis of superficial vessels of right lower extremity: Secondary | ICD-10-CM | POA: Diagnosis not present

## 2019-09-11 DIAGNOSIS — Z794 Long term (current) use of insulin: Secondary | ICD-10-CM | POA: Diagnosis not present

## 2019-09-11 DIAGNOSIS — E039 Hypothyroidism, unspecified: Secondary | ICD-10-CM | POA: Diagnosis not present

## 2019-09-11 DIAGNOSIS — M7989 Other specified soft tissue disorders: Secondary | ICD-10-CM | POA: Diagnosis not present

## 2019-09-11 NOTE — ED Provider Notes (Signed)
[facility] EMERGENCY DEPARTMENT Provider Note   CSN: [id number] Arrival date & time: 09/11/19  1342     History Chief Complaint  Patient presents with  . Leg Pain    Derek Prince is a 44 y.o. male.  Patient with history of diabetes, hyperlipidemia, hypertension presents the emergency department with right ankle swelling and right calf pain.  Patient had a Roux-en-Y gastric bypass performed on 08/18/2019.  Patient states that he is recovering well but reports being generally weak after the surgery.  Shortly after the surgery he developed pain and swelling in his right ankle.  He states that he was tested for gout and a test came back negative.  He states that he was treated with a medicine that started with a C (colchicine?)  However this did not help his symptoms.  He then had an x-ray which he states showed "bone spurs".  He had a steroid injection in the ankle.  This was performed at a "walk-in" clinic.  Patient went back into the office today and was referred to the emergency department for evaluation of DVT.  He denies any current chest pain or worsening shortness of breath.  No fevers, cough, nausea, vomiting, diarrhea.  Patient denies risk factors for DVT including: history of DVT/PE/other blood clots, use of exogenous hormones, recent immobilizations, recent travel (>4hr segment), malignancy, hemoptysis.          Past Medical History:  Diagnosis Date  . Depression   . Diabetes mellitus without complication (HCC)   . Hyperlipidemia   . Hypertension   . Hypothyroidism   . Thyroid disease     Patient Active Problem List   Diagnosis Date Noted  . Morbid obesity (HCC) 08/18/2019  . Obesity 12/05/2016  . Uncontrolled type 2 diabetes mellitus with hyperglycemia, without long-term current use of insulin (HCC) 12/05/2016    Past Surgical History:  Procedure Laterality Date  . GASTRIC ROUX-EN-Y N/A 08/18/2019   Procedure: LAPAROSCOPIC ROUX-EN-Y GASTRIC BYPASS  WITH UPPER ENDOSCOPY, ERAS Pathway;  Surgeon: [name], MD;  Location: [facility];  Service: General;  Laterality: N/A;  . HERNIA REPAIR         Family History  Problem Relation Age of Onset  . Hyperlipidemia Other   . Diabetes Other     Social History   Tobacco Use  . Smoking status: Former Smoker    Quit date: 2015    Years since quitting: 6.5  . Smokeless tobacco: Never Used  Vaping Use  . Vaping Use: Never used  Substance Use Topics  . Alcohol use: Never  . Drug use: No    Home Medications Prior to Admission medications   Medication Sig Start Date End Date Taking? Authorizing Provider  docusate sodium (COLACE) 100 MG capsule Take 1 capsule (100 mg total) by mouth 2 (two) times daily. OK to decrease to once daily or stop taking If having loose bowel movements. 08/19/19   [name], MD  gabapentin (NEURONTIN) 100 MG capsule Take 2 capsules (200 mg total) by mouth every 12 (twelve) hours. 08/19/19   [name], MD  insulin aspart (NOVOLOG) 100 UNIT/ML injection Inject 0-20 Units into the skin 4 (four) times daily -  with meals and at bedtime. CBG 70-120: 0 units CBG 121-150: 3 units CBG 151-200: 4 units CBG 201-250: 7 units CBG 251-300: 11 units CBG 301-350: 15 units CBG 351-400: 20 units 08/19/19   [name], MD  levothyroxine (SYNTHROID, LEVOTHROID) 112 MCG  tablet Take 224 mcg by mouth daily. 04/14/18   [provider]  ondansetron (ZOFRAN-ODT) 4 MG disintegrating tablet Take 1 tablet (4 mg total) by mouth every 6 (six) hours as needed for nausea or vomiting. 08/19/19   [name], MD  pantoprazole (PROTONIX) 40 MG tablet Take 1 tablet (40 mg total) by mouth daily. 08/19/19   [name], MD  traMADol (ULTRAM) 50 MG tablet Take 1 tablet (50 mg total) by mouth every 6 (six) hours as needed (pain). 08/19/19   [name], MD  VYVANSE 70 MG capsule Take 70 mg by mouth every Monday, Tuesday, Wednesday, Thursday, and Friday.  04/17/18    [provider]    Allergies    Xigduo xr [dapagliflozin-metformin hcl er]  Review of Systems   Review of Systems  Constitutional: Negative for fever.  HENT: Negative for rhinorrhea and sore throat.   Eyes: Negative for redness.  Respiratory: Negative for cough.   Cardiovascular: Positive for leg swelling. Negative for chest pain.  Gastrointestinal: Negative for abdominal pain, diarrhea, nausea and vomiting.  Genitourinary: Negative for dysuria.  Musculoskeletal: Positive for gait problem, joint swelling and myalgias. Negative for back pain.  Skin: Negative for color change and rash.  Neurological: Negative for headaches.    Physical Exam Updated Vital Signs BP (!) 122/96 (BP Location: Left Arm)   Pulse 83   Temp 98.3 F (36.8 C) (Oral)   Resp 20   Ht 6\' 3"  (1.905 m)   Wt (!) 140.6 kg   SpO2 100%   BMI 38.75 kg/m   Physical Exam Vitals and nursing note reviewed.  Constitutional:      Appearance: He is well-developed.  HENT:     Head: Normocephalic and atraumatic.  Eyes:     General:        Right eye: No discharge.        Left eye: No discharge.     Conjunctiva/sclera: Conjunctivae normal.  Cardiovascular:     Rate and Rhythm: Normal rate and regular rhythm.     Pulses:          Dorsalis pedis pulses are 2+ on the right side and 2+ on the left side.       Posterior tibial pulses are 2+ on the right side and 2+ on the left side.     Heart sounds: Normal heart sounds.     Comments: Normal pedal pulses bilaterally. Pulmonary:     Effort: Pulmonary effort is normal.     Breath sounds: Normal breath sounds.  Abdominal:     Palpations: Abdomen is soft.     Tenderness: There is no abdominal tenderness. There is no guarding or rebound.  Musculoskeletal:     Cervical back: Normal range of motion and neck supple.     Comments: Right ankle and foot: Minimal swelling of the right foot with moderate swelling of the right ankle with pain with passive range of  motion.  Ankle is slightly warm, no overlying erythema.  Minimal tenderness and swelling of the distal calf.  Skin:    General: Skin is warm and dry.  Neurological:     Mental Status: He is alert.     ED Results / Procedures / Treatments   Labs (all labs ordered are listed, but only abnormal results are displayed) Labs Reviewed - No data to display  EKG None  Radiology Venous Img Lower Right (DVT Study)  Result Date: 09/11/2019 CLINICAL DATA:  Lower extremity  swelling EXAM: RIGHT LOWER EXTREMITY VENOUS DOPPLER ULTRASOUND TECHNIQUE: Gray-scale sonography with compression, as well as color and duplex ultrasound, were performed to evaluate the deep venous system(s) from the level of the common femoral vein through the popliteal and proximal calf veins. COMPARISON:  None. FINDINGS: VENOUS Normal compressibility of the common femoral, superficial femoral, and popliteal veins, as well as the visualized calf veins. Visualized portions of profunda femoral vein and great saphenous vein unremarkable. No filling defects to suggest DVT on grayscale or color Doppler imaging. Doppler waveforms show normal direction of venous flow, normal respiratory plasticity and response to augmentation. Limited views of the contralateral common femoral vein are unremarkable. OTHER Incidental note is made of eccentric wall thickening in the small saphenous vein along the posterior aspect of the calf. This renders the vessel only partially compressible. There is evidence of color flow on color Doppler imaging. Findings are most consistent with partially recanalized chronic superficial thrombus. Limitations: none IMPRESSION: 1. Negative for deep venous thrombosis. 2. Positive for partially recanalized subacute to chronic superficial thrombosis within the small saphenous vein along the posterior calf. Electronically Signed   By: Malachy Moan M.D.   On: 09/11/2019 14:57    Procedures Procedures (including critical care  time)  Medications Ordered in ED Medications - No data to display  ED Course  I have reviewed the triage vital signs and the nursing notes.  Pertinent labs & imaging results that were available during my care of the patient were reviewed by me and considered in my medical decision making (see chart for details).  Patient seen and examined. Here for ultrasound to r/o blood clot mainly. [place] ordered. May benefit from orthopedic evaluation if no obvious cause. I do not see any signs of infection today. Pt with reasonable ROM making septic arthritis less likely.   Vital signs reviewed and are as follows: BP (!) 122/96 (BP Location: Left Arm)   Pulse 83   Temp 98.3 F (36.8 C) (Oral)   Resp 20   Ht 6\' 3"  (1.905 m)   Wt (!) 140.6 kg   SpO2 100%   BMI 38.75 kg/m   3:41 PM DVT study discussed with Dr. . Pt updated. Given recent surgery, will speak with vascular surgery regarding recommendations.   4:09 PM Spoke with Dr. Particia Nearing of vascular surgery regarding treatment reccs. Typical therapy would be NSAIDs and warm compresses. Aware patient cannot have NSAIDs. Recommends that compression stockings may help. States no role for anticoagulation as this is superficial.    4:20 PM Pt updated on results and recommendations.  He is frustrated that he has already been doing warm soaks with compression and does not feeling better.  I have offered sports medicine follow-up referral.  Patient asks if there are other x-rays or things that we can do today.  I offered repeating plain film x-rays, but he has already had these done and showed bone spurs.  Patient asked if this could be related to a tendon or other injury and if asked if we could evaluate him for that.  I told him that would be an outpatient work-up and he he could coordinate with a sports medicine referral.  Patient states that he would like to follow up with his primary care doctor.  Patient walked out of the emergency department prior to  obtaining discharge paperwork.     MDM Rules/Calculators/A&P  Patient with right ankle and calf swelling over the past month.  Sent in today to rule out DVT.  Patient has an area of chronic to subacute superficial thrombosis of the right small saphenous -- no DVT.  Unclear if this entirely explains the patient's symptoms.  No evidence of cellulitis.  Doubt septic arthritis.  Encouraged PCP/orthopedic follow-up.    Final Clinical Impression(s) / ED Diagnoses Final diagnoses:  Right ankle swelling  Right calf pain    Rx / DC Orders ED Discharge Orders    None       Renne CriglerGeiple, Phyllicia Dudek, PA-C 09/11/19 1624    Jacalyn LefevreHaviland, Julie, MD 09/11/19 2038

## 2019-09-11 NOTE — ED Triage Notes (Signed)
Pt c/o pain/swelling to right LE since 6/4-reports multiple PCP walk in clinic visits with dx/tx "gout and bone spur"-seen.sent today to r/o "blood clot"-NAD-steady gait

## 2019-09-11 NOTE — ED Notes (Signed)
Patient transported to Ultrasound 

## 2019-09-11 NOTE — ED Notes (Signed)
Pt left before receiving dc instruction papers.

## 2019-09-15 ENCOUNTER — Other Ambulatory Visit: Payer: Self-pay

## 2019-09-15 ENCOUNTER — Ambulatory Visit
Admission: RE | Admit: 2019-09-15 | Discharge: 2019-09-15 | Disposition: A | Discharge status: Home / Self Care | Payer: BC Managed Care – PPO | Source: Ambulatory Visit | Attending: Family Medicine | Admitting: Family Medicine

## 2019-09-15 ENCOUNTER — Other Ambulatory Visit: Payer: Self-pay | Admitting: Family Medicine

## 2019-09-15 DIAGNOSIS — R52 Pain, unspecified: Secondary | ICD-10-CM

## 2019-09-15 DIAGNOSIS — S92241A Displaced fracture of medial cuneiform of right foot, initial encounter for closed fracture: Secondary | ICD-10-CM | POA: Diagnosis not present

## 2019-09-15 DIAGNOSIS — M79671 Pain in right foot: Secondary | ICD-10-CM | POA: Diagnosis not present

## 2019-09-15 DIAGNOSIS — S92101A Unspecified fracture of right talus, initial encounter for closed fracture: Secondary | ICD-10-CM | POA: Diagnosis not present

## 2019-09-17 DIAGNOSIS — M25571 Pain in right ankle and joints of right foot: Secondary | ICD-10-CM | POA: Diagnosis not present

## 2019-09-18 DIAGNOSIS — M14671 Charcot's joint, right ankle and foot: Secondary | ICD-10-CM | POA: Diagnosis not present

## 2019-09-21 DIAGNOSIS — E1165 Type 2 diabetes mellitus with hyperglycemia: Secondary | ICD-10-CM | POA: Diagnosis not present

## 2019-09-21 DIAGNOSIS — E039 Hypothyroidism, unspecified: Secondary | ICD-10-CM | POA: Diagnosis not present

## 2019-09-22 ENCOUNTER — Other Ambulatory Visit: Payer: Self-pay | Admitting: Orthopaedic Surgery

## 2019-09-22 ENCOUNTER — Ambulatory Visit
Admission: RE | Admit: 2019-09-22 | Discharge: 2019-09-22 | Disposition: A | Discharge status: Home / Self Care | Payer: BC Managed Care – PPO | Source: Ambulatory Visit | Attending: Orthopaedic Surgery | Admitting: Orthopaedic Surgery

## 2019-09-22 DIAGNOSIS — S92251A Displaced fracture of navicular [scaphoid] of right foot, initial encounter for closed fracture: Secondary | ICD-10-CM | POA: Diagnosis not present

## 2019-09-22 DIAGNOSIS — M79671 Pain in right foot: Secondary | ICD-10-CM

## 2019-09-25 DIAGNOSIS — M14671 Charcot's joint, right ankle and foot: Secondary | ICD-10-CM | POA: Diagnosis not present

## 2019-09-28 DIAGNOSIS — E1140 Type 2 diabetes mellitus with diabetic neuropathy, unspecified: Secondary | ICD-10-CM | POA: Diagnosis not present

## 2019-09-28 DIAGNOSIS — E039 Hypothyroidism, unspecified: Secondary | ICD-10-CM | POA: Diagnosis not present

## 2019-09-28 DIAGNOSIS — E1165 Type 2 diabetes mellitus with hyperglycemia: Secondary | ICD-10-CM | POA: Diagnosis not present

## 2019-10-13 ENCOUNTER — Other Ambulatory Visit: Payer: Self-pay

## 2019-10-13 ENCOUNTER — Encounter: Payer: BC Managed Care – PPO | Attending: Surgery | Admitting: Skilled Nursing Facility1

## 2019-10-13 DIAGNOSIS — E669 Obesity, unspecified: Secondary | ICD-10-CM | POA: Insufficient documentation

## 2019-10-13 NOTE — Progress Notes (Signed)
Bariatric Nutrition Follow-Up Visit Medical Nutrition Therapy   NUTRITION ASSESSMENT    Anthropometrics  Surgery date: 08/18/2019 Surgery type: RYGB Start weight at [facility]: 341 Weight today: 298.1  Body Composition Scale 10/13/2019  Weight  lbs 298.1  Total Body Fat  % 32.5     Visceral Fat 23  Fat-Free Mass  % 67.4     Total Body Water  % 48.4     Muscle-Mass  lbs 56.2  BMI 37.2  Body Fat Displacement ---        Torso  lbs 60.2        Left Leg  lbs 12        Right Leg  lbs 12        Left Arm  lbs 6        Right Arm  lbs 6   Clinical  Medical hx: gout, diabetes Medications: see list Labs:    Lifestyle & Dietary Hx  Pt states he actually was not having a gouty flare up previously but was a broken foot. Pt states he feel she is not losing enough weight. Pt states he has eaten pizza. Pt states he was bored with pizza so he has eaten outside of the phase: dietitian educated pt on this topic.  Pt states her thyroid labs were not within normal limits stating he was advised to increase his medication.  Pt states his blood surags numbers are 109, 107 with an A1C of 8.1.  Pt states he weighs himself every day.   Estimated daily fluid intake: 80+ oz Estimated daily protein intake: 100+ g Supplements: bar advantage and calcium  Current average weekly physical activity: 30 minutes stationary 4-5 days a week and some weights   24-Hr Dietary Recall First Meal: protein shake Snack:  Second Meal: protein shake Snack:  Third Meal: chicken or tilapia Snack:  Beverages: water, Gatorade zero, powerade zero  Post-Op Goals/ Signs/ Symptoms Using straws: no Drinking while eating: no Chewing/swallowing difficulties: no Changes in vision: no Changes to mood/headaches: no Hair loss/changes to skin/nails: no Difficulty focusing/concentrating: no Sweating: no Dizziness/lightheadedness: no Palpitations: no  Carbonated/caffeinated beverages: no N/V/D/C/Gas: bowel movement every 2-3  days  Abdominal pain: no Dumping syndrome: no    NUTRITION DIAGNOSIS  Overweight/obesity ([place]-3.3) related to past poor dietary habits and physical inactivity as evidenced by completed bariatric surgery and following dietary guidelines for continued weight loss and healthy nutrition status.     NUTRITION INTERVENTION Nutrition counseling (C-1) and education (E-2) to facilitate bariatric surgery goals, including: . The importance of consuming adequate calories as well as certain nutrients daily due to the body's need for essential vitamins, minerals, and fats . The importance of daily physical activity and to reach a goal of at least 150 minutes of moderate to vigorous physical activity weekly (or as directed by their physician) due to benefits such as increased musculature and improved lab values . The importance of intuitive eating specifically learning hunger-satiety cues and understanding the importance of learning a new body  Goals: -Continue to aim for a minimum of 64 fluid ounces 7 days a week with at least 30 ounces being plain water -Eat non-starchy vegetables 2 times a day 7 days a week -Start out with soft cooked vegetables today and tomorrow; if tolerated begin to eat raw vegetables or cooked including salads -Eat your 3 ounces of protein first then start in on your non-starchy vegetables; once you understand how much of your meal leads to satisfaction and not  full while still eating 3 ounces of protein and non-starchy vegetables you can eat them in any order  -Continue to aim for 30 minutes of activity at least 5 times a week -Do NOT cook with/add to your food: alfredo sauce, cheese sauce, barbeque sauce, ketchup, fat back, butter, bacon grease, grease, Crisco, OR SUGAR   Handouts Provided Include   Phase 4  Learning Style & Readiness for Change Teaching method utilized: Visual & Auditory  Demonstrated degree of understanding via: Teach Back  Barriers to learning/adherence  to lifestyle change: none identified   RD's Notes for Next Visit . Assess adherence to pt chosen goals   MONITORING & EVALUATION Dietary intake, weekly physical activity, body weight  Next Steps Patient is to follow-up in November

## 2019-10-23 DIAGNOSIS — R5383 Other fatigue: Secondary | ICD-10-CM | POA: Diagnosis not present

## 2019-10-23 DIAGNOSIS — E039 Hypothyroidism, unspecified: Secondary | ICD-10-CM | POA: Diagnosis not present

## 2019-10-23 DIAGNOSIS — F909 Attention-deficit hyperactivity disorder, unspecified type: Secondary | ICD-10-CM | POA: Diagnosis not present

## 2019-10-23 DIAGNOSIS — M79671 Pain in right foot: Secondary | ICD-10-CM | POA: Diagnosis not present

## 2019-10-23 DIAGNOSIS — Z79899 Other long term (current) drug therapy: Secondary | ICD-10-CM | POA: Diagnosis not present

## 2019-11-06 DIAGNOSIS — M14671 Charcot's joint, right ankle and foot: Secondary | ICD-10-CM | POA: Diagnosis not present

## 2019-11-23 DIAGNOSIS — M62830 Muscle spasm of back: Secondary | ICD-10-CM | POA: Diagnosis not present

## 2019-12-02 DIAGNOSIS — M14671 Charcot's joint, right ankle and foot: Secondary | ICD-10-CM | POA: Diagnosis not present

## 2019-12-02 DIAGNOSIS — M79672 Pain in left foot: Secondary | ICD-10-CM | POA: Diagnosis not present

## 2019-12-07 DIAGNOSIS — M9901 Segmental and somatic dysfunction of cervical region: Secondary | ICD-10-CM | POA: Diagnosis not present

## 2019-12-07 DIAGNOSIS — M9904 Segmental and somatic dysfunction of sacral region: Secondary | ICD-10-CM | POA: Diagnosis not present

## 2019-12-07 DIAGNOSIS — M9903 Segmental and somatic dysfunction of lumbar region: Secondary | ICD-10-CM | POA: Diagnosis not present

## 2019-12-07 DIAGNOSIS — M9902 Segmental and somatic dysfunction of thoracic region: Secondary | ICD-10-CM | POA: Diagnosis not present

## 2019-12-07 IMAGING — CR DG FOOT COMPLETE 3+V*R*
4 series · 4 of 4 positions shown · non-contrast
Comparison: None.

CLINICAL DATA: Ulcer involving the plantar and medial aspects of
the right great toe 2 with pain for 2 weeks. History of diabetes.

EXAM:
RIGHT FOOT COMPLETE - 3+ VIEW

[x foot ap right]
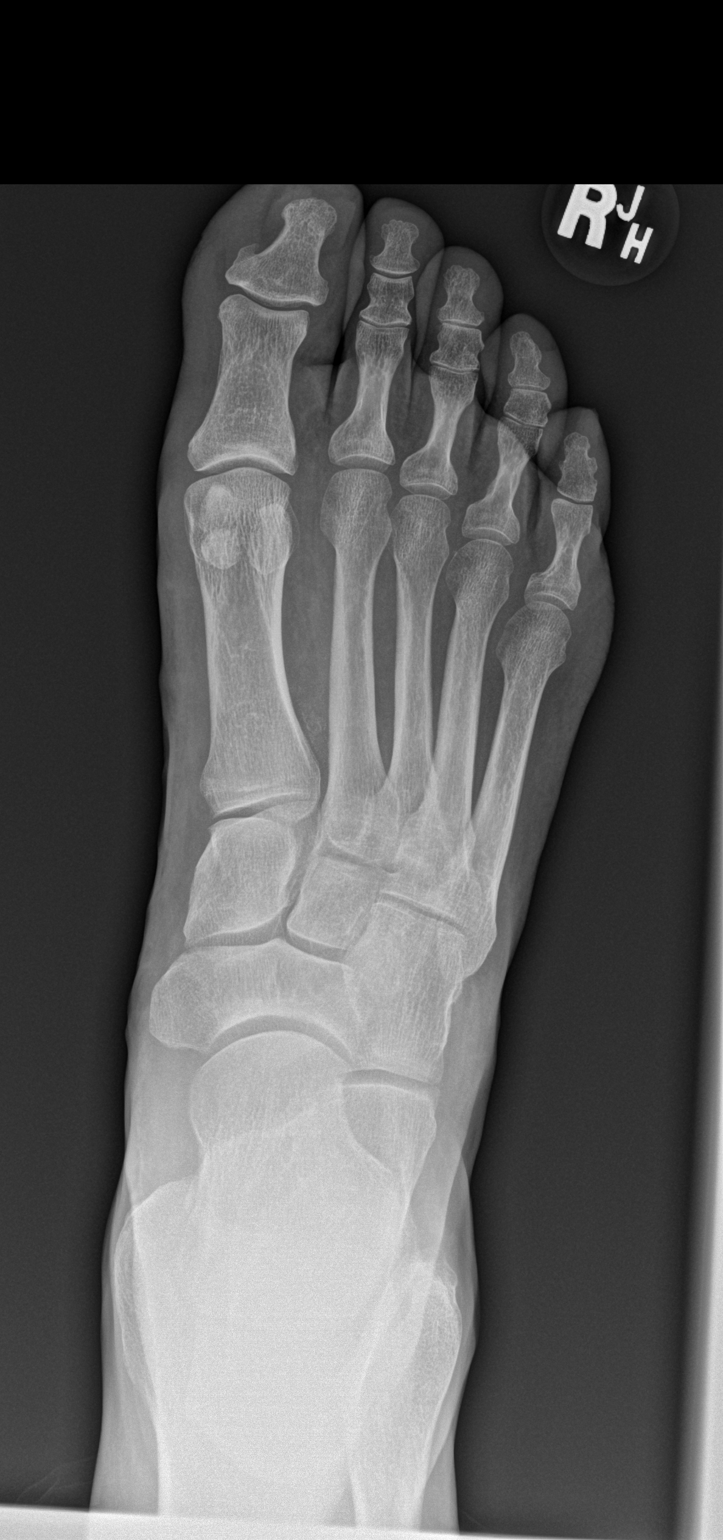

[x foot obl right]
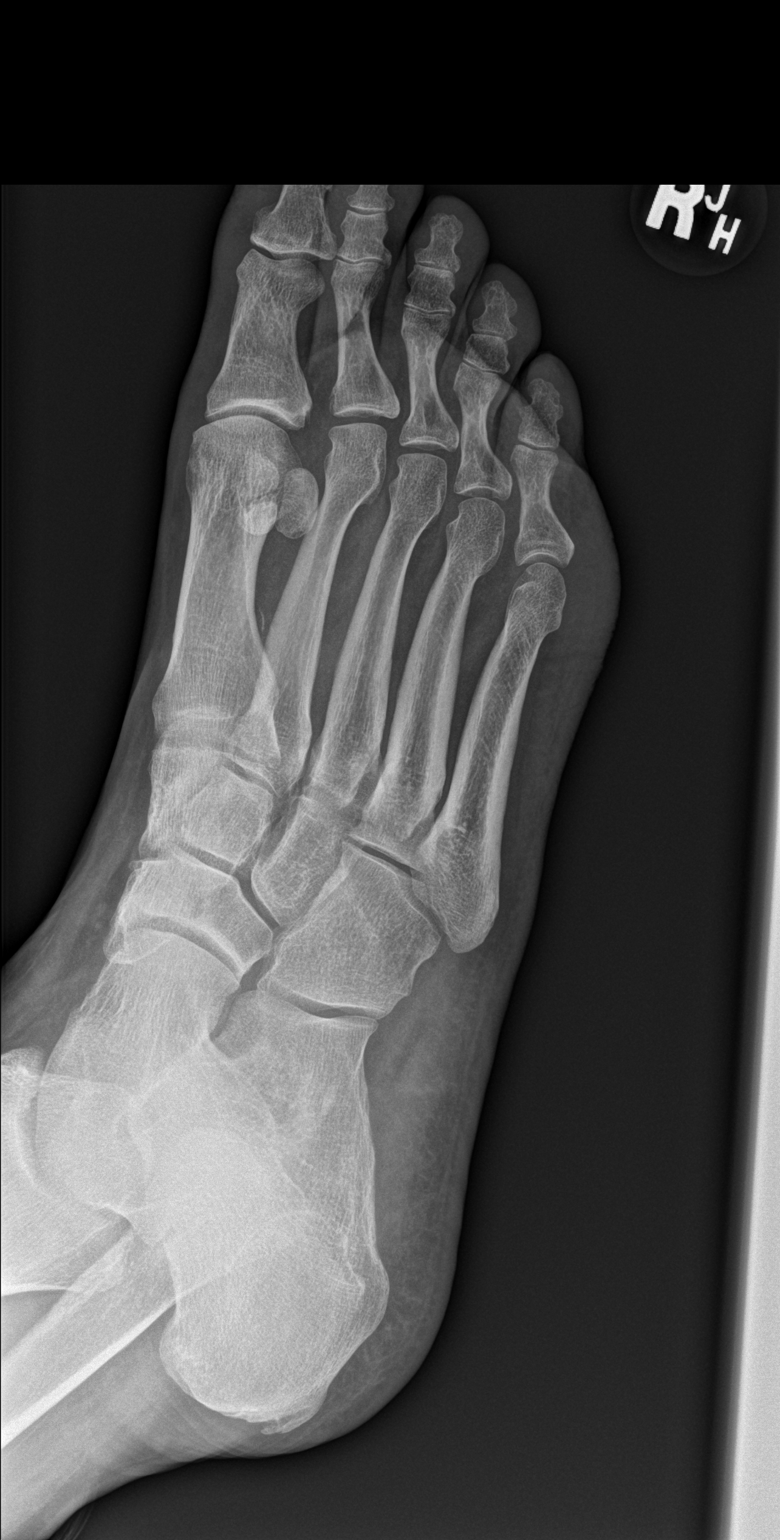

[x foot lat right (1 of 2)]
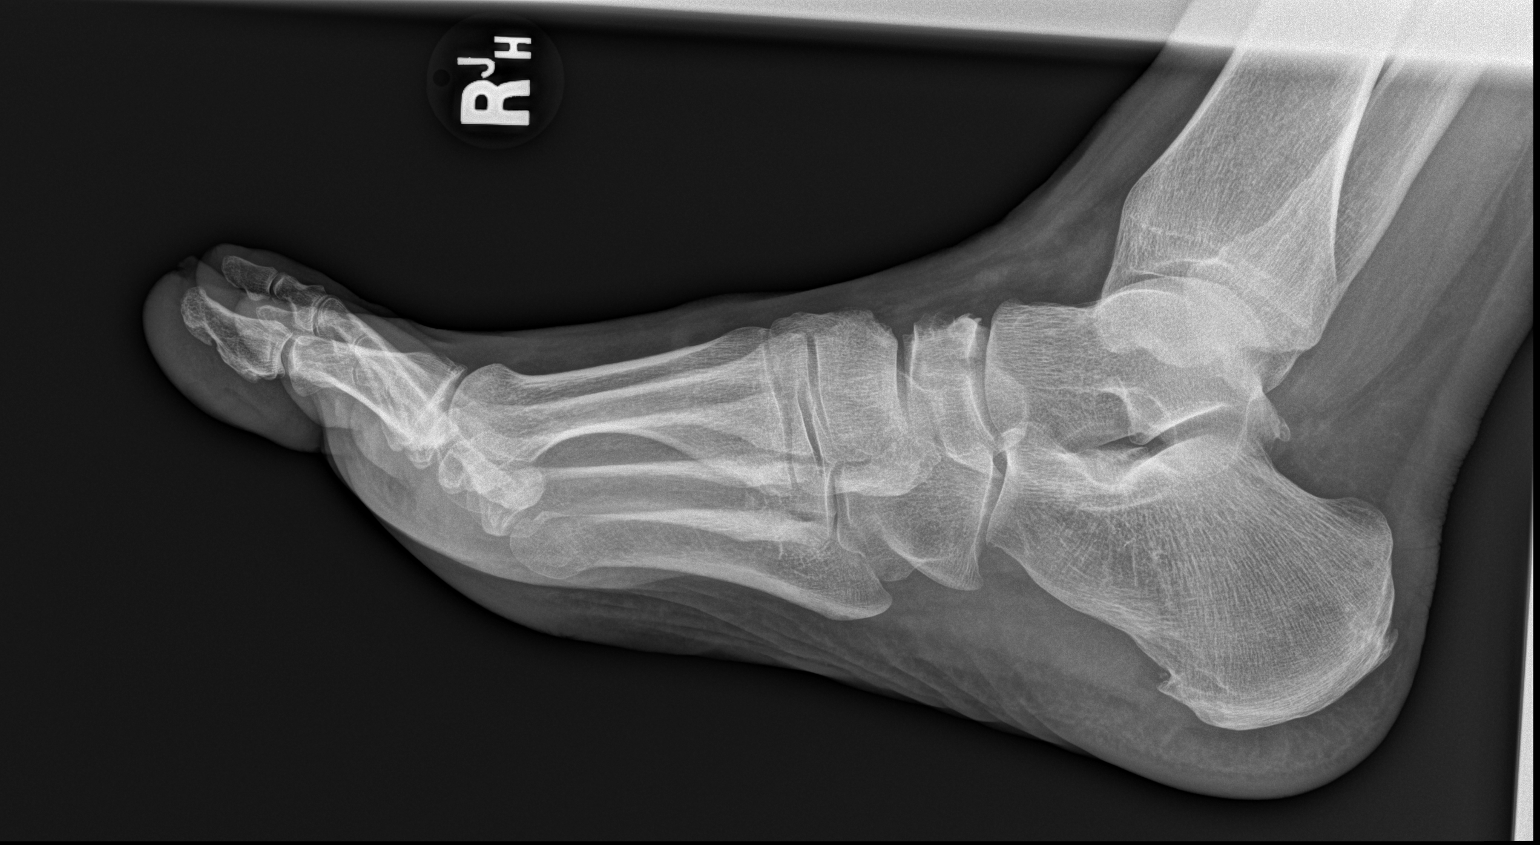

[x foot lat right (2 of 2)]
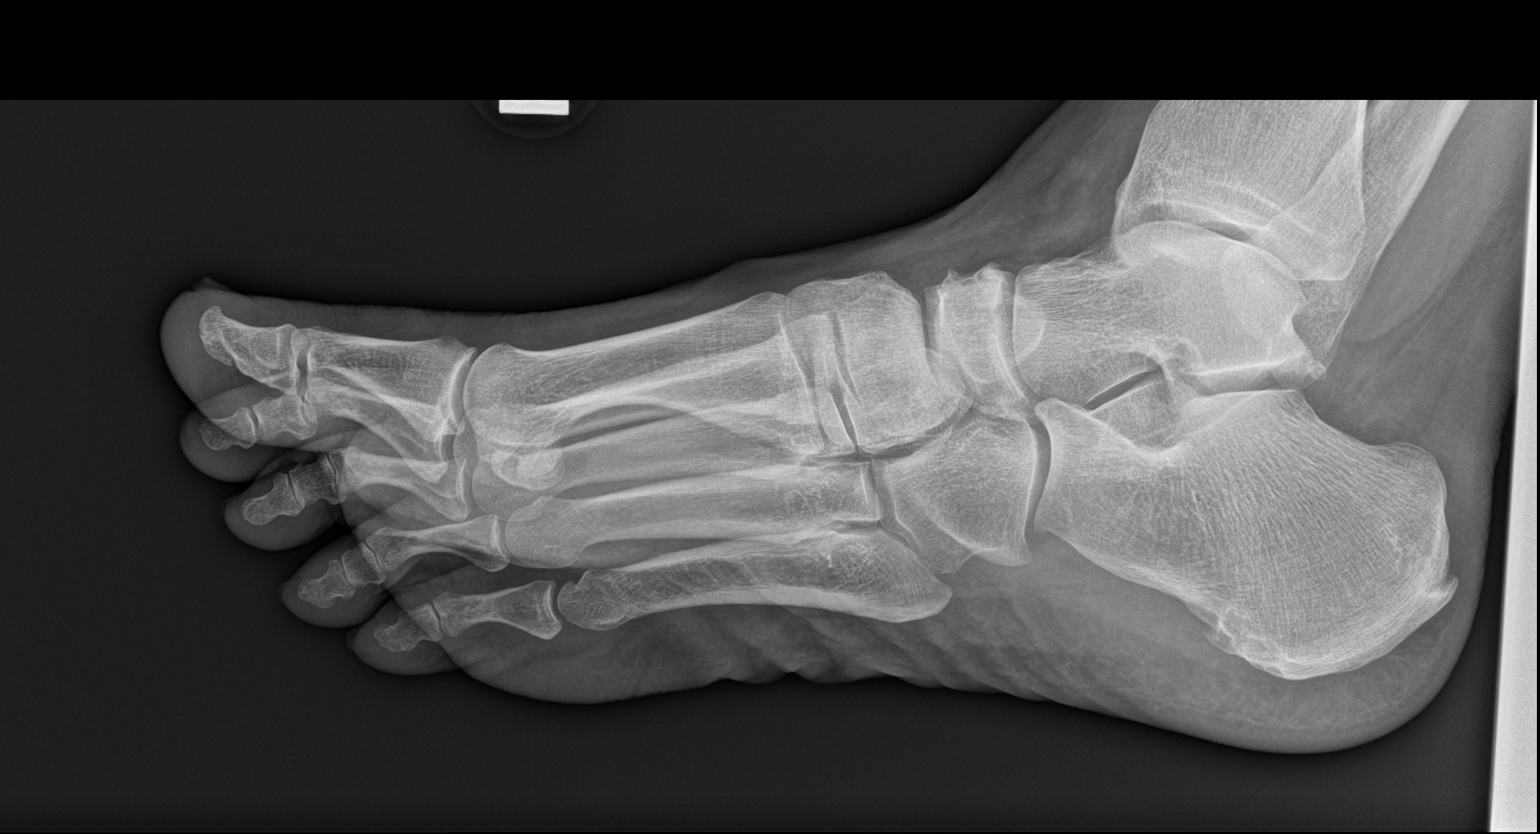

[4 of 4 positions shown; findings below may reference images not displayed]

FINDINGS: Small curvilinear lucency in the plantar soft tissues of the great
toe is consistent with the history of an ulcer. No dissecting soft
tissue emphysema, radiopaque foreign body, osseous erosion,
fracture, or dislocation is identified. There are small posterior
and plantar calcaneal enthesophytes.
IMPRESSION: No evidence of osteomyelitis.

## 2019-12-08 DIAGNOSIS — M9902 Segmental and somatic dysfunction of thoracic region: Secondary | ICD-10-CM | POA: Diagnosis not present

## 2019-12-08 DIAGNOSIS — M9901 Segmental and somatic dysfunction of cervical region: Secondary | ICD-10-CM | POA: Diagnosis not present

## 2019-12-08 DIAGNOSIS — M9904 Segmental and somatic dysfunction of sacral region: Secondary | ICD-10-CM | POA: Diagnosis not present

## 2019-12-08 DIAGNOSIS — M9903 Segmental and somatic dysfunction of lumbar region: Secondary | ICD-10-CM | POA: Diagnosis not present

## 2019-12-09 DIAGNOSIS — M9903 Segmental and somatic dysfunction of lumbar region: Secondary | ICD-10-CM | POA: Diagnosis not present

## 2019-12-09 DIAGNOSIS — M9904 Segmental and somatic dysfunction of sacral region: Secondary | ICD-10-CM | POA: Diagnosis not present

## 2019-12-09 DIAGNOSIS — M9902 Segmental and somatic dysfunction of thoracic region: Secondary | ICD-10-CM | POA: Diagnosis not present

## 2019-12-09 DIAGNOSIS — M9901 Segmental and somatic dysfunction of cervical region: Secondary | ICD-10-CM | POA: Diagnosis not present

## 2019-12-17 DIAGNOSIS — M14671 Charcot's joint, right ankle and foot: Secondary | ICD-10-CM | POA: Diagnosis not present

## 2019-12-17 DIAGNOSIS — M25671 Stiffness of right ankle, not elsewhere classified: Secondary | ICD-10-CM | POA: Diagnosis not present

## 2019-12-17 DIAGNOSIS — M545 Low back pain, unspecified: Secondary | ICD-10-CM | POA: Diagnosis not present

## 2019-12-17 DIAGNOSIS — R262 Difficulty in walking, not elsewhere classified: Secondary | ICD-10-CM | POA: Diagnosis not present

## 2019-12-23 DIAGNOSIS — Z09 Encounter for follow-up examination after completed treatment for conditions other than malignant neoplasm: Secondary | ICD-10-CM | POA: Diagnosis not present

## 2019-12-23 DIAGNOSIS — Z9884 Bariatric surgery status: Secondary | ICD-10-CM | POA: Diagnosis not present

## 2019-12-23 DIAGNOSIS — M25671 Stiffness of right ankle, not elsewhere classified: Secondary | ICD-10-CM | POA: Diagnosis not present

## 2019-12-23 DIAGNOSIS — M545 Low back pain, unspecified: Secondary | ICD-10-CM | POA: Diagnosis not present

## 2019-12-23 DIAGNOSIS — R262 Difficulty in walking, not elsewhere classified: Secondary | ICD-10-CM | POA: Diagnosis not present

## 2019-12-23 DIAGNOSIS — M14671 Charcot's joint, right ankle and foot: Secondary | ICD-10-CM | POA: Diagnosis not present

## 2019-12-30 DIAGNOSIS — M25671 Stiffness of right ankle, not elsewhere classified: Secondary | ICD-10-CM | POA: Diagnosis not present

## 2019-12-30 DIAGNOSIS — R262 Difficulty in walking, not elsewhere classified: Secondary | ICD-10-CM | POA: Diagnosis not present

## 2019-12-30 DIAGNOSIS — M545 Low back pain, unspecified: Secondary | ICD-10-CM | POA: Diagnosis not present

## 2019-12-30 DIAGNOSIS — M14671 Charcot's joint, right ankle and foot: Secondary | ICD-10-CM | POA: Diagnosis not present

## 2020-01-19 ENCOUNTER — Encounter: Payer: BC Managed Care – PPO | Attending: Surgery | Admitting: Skilled Nursing Facility1

## 2020-01-19 ENCOUNTER — Other Ambulatory Visit: Payer: Self-pay

## 2020-01-19 DIAGNOSIS — M1460 Charcot's joint, unspecified site: Secondary | ICD-10-CM | POA: Diagnosis not present

## 2020-01-19 DIAGNOSIS — E113393 Type 2 diabetes mellitus with moderate nonproliferative diabetic retinopathy without macular edema, bilateral: Secondary | ICD-10-CM | POA: Diagnosis not present

## 2020-01-19 DIAGNOSIS — F909 Attention-deficit hyperactivity disorder, unspecified type: Secondary | ICD-10-CM | POA: Diagnosis not present

## 2020-01-19 DIAGNOSIS — E039 Hypothyroidism, unspecified: Secondary | ICD-10-CM | POA: Diagnosis not present

## 2020-01-19 DIAGNOSIS — E669 Obesity, unspecified: Secondary | ICD-10-CM | POA: Insufficient documentation

## 2020-01-19 DIAGNOSIS — Z23 Encounter for immunization: Secondary | ICD-10-CM | POA: Diagnosis not present

## 2020-01-20 NOTE — Progress Notes (Addendum)
Follow-up visit:  Post-Operative  RYGB Surgery  Medical Nutrition Therapy:  Appt start time: 6:00pm end time:  7:00pm  Primary concerns today: Post-operative Bariatric Surgery Nutrition Management 6 Month Post-Op Class   Anthropometrics  Surgery date: 08/18/2019 Surgery type: RYGB Start weight at [facility]: 341 Weight today: pt declined  Body Composition Scale 10/13/2019  Weight  lbs 298.1  Total Body Fat  % 32.5     Visceral Fat 23  Fat-Free Mass  % 67.4     Total Body Water  % 48.4     Muscle-Mass  lbs 56.2  BMI 37.2  Body Fat Displacement ---        Torso  lbs 60.2        Left Leg  lbs 12        Right Leg  lbs 12        Left Arm  lbs 6        Right Arm  lbs 6    Information Reviewed/ Discussed During Appointment: -Review of composition scale numbers -Fluid requirements (64-100 ounces) -Protein requirements (60-80g) -Strategies for tolerating diet -Advancement of diet to include Starchy vegetables -Barriers to inclusion of new foods -Inclusion of appropriate multivitamin and calcium supplements  -Exercise recommendations   Fluid intake: adequate   Medications: See List Supplementation: appropriate    Using straws: no Drinking while eating: no Having you been chewing well: yes Chewing/swallowing difficulties: no Changes in vision: no Changes to mood/headaches: no Hair loss/Cahnges to skin/Changes to nails: no Any difficulty focusing or concentrating: no Sweating: no Dizziness/Lightheaded: no Palpitations: no  Carbonated beverages: no N/V/D/C/GAS: no Abdominal Pain: no Dumping syndrome: no  Recent physical activity:  ADL's  Progress Towards Goal(s):  In Progress  Handouts given during visit include:  Phase V diet Progression   Goals Sheet  The Benefits of Exercise are endless.....  Support Group Topics  Pt Chosen Goals: Nutrition Goals:  I will eat 3 meals a day 5 days a week by (specific date) _by next appointment___________ I will drink 64  ounces of any sugar free, carbonation free option 7 days a week by (specific date) ____by next appointment ____ I will conduct (specific number) ___3_____ stress reducing activities a week by (specific date) ____________by next appoinment____.  Name the Activities: _walking, _______________  _biking_____________________          ___________________  ____________________  Teaching Method Utilized:  Visual Auditory Hands on  Demonstrated degree of understanding via:  Teach Back   Monitoring/Evaluation:  Dietary intake, exercise, and body weight. Follow up in 3 months for 9 month post-op visit.

## 2020-02-03 DIAGNOSIS — E039 Hypothyroidism, unspecified: Secondary | ICD-10-CM | POA: Diagnosis not present

## 2020-02-10 DIAGNOSIS — E1165 Type 2 diabetes mellitus with hyperglycemia: Secondary | ICD-10-CM | POA: Diagnosis not present

## 2020-02-10 DIAGNOSIS — E039 Hypothyroidism, unspecified: Secondary | ICD-10-CM | POA: Diagnosis not present

## 2020-02-10 DIAGNOSIS — Z9884 Bariatric surgery status: Secondary | ICD-10-CM | POA: Diagnosis not present

## 2020-02-10 DIAGNOSIS — E1140 Type 2 diabetes mellitus with diabetic neuropathy, unspecified: Secondary | ICD-10-CM | POA: Diagnosis not present

## 2020-03-17 ENCOUNTER — Ambulatory Visit (INDEPENDENT_AMBULATORY_CARE_PROVIDER_SITE_OTHER): Payer: BC Managed Care – PPO

## 2020-03-17 ENCOUNTER — Other Ambulatory Visit: Payer: Self-pay

## 2020-03-17 ENCOUNTER — Encounter: Payer: Self-pay | Admitting: Podiatry

## 2020-03-17 ENCOUNTER — Ambulatory Visit: Payer: BC Managed Care – PPO | Admitting: Podiatry

## 2020-03-17 DIAGNOSIS — M14671 Charcot's joint, right ankle and foot: Secondary | ICD-10-CM

## 2020-03-17 DIAGNOSIS — L600 Ingrowing nail: Secondary | ICD-10-CM

## 2020-03-17 DIAGNOSIS — M79671 Pain in right foot: Secondary | ICD-10-CM

## 2020-03-17 NOTE — Patient Instructions (Signed)

## 2020-03-17 NOTE — Progress Notes (Signed)
Subjective:   Patient ID: Lenox Ahr, male   DOB: 45 y.o.   MRN: [id number]   HPI Patient presents with a thickened dystrophic fifth nail right that is bothersome and also has a collapsed medial longitudinal arch right which occurred in June with patient wearing a cast for approximately 4 weeks and the pain continuing at this time with swelling and difficulty weightbearing.  Patient has lost 100 pounds since June and is no longer running abnormal A1c's   ROS      Objective:  Physical Exam  Neurovascular status shows hyperemia of the right foot and ankle with good circulatory status and a thickened ingrown right fifth nail that is painful when pressed dorsally.  He has a lot of pain secondary to deformity which is consistent with probable Charcot type foot structure and neuropathy     Assessment:  Charcot foot right with neuropathy with patient who has lost 100 pounds and wants to be more active along with damaged fifth nail right     Plan:  H&P reviewed conditions discussed Charcot foot and reviewed x-ray with him.  He is going to start boot for another 4 weeks and I am going to have him see Dr. Lilian Kapur for consideration of the meaning of the right foot with navicular bone which is completely dislocated.  Today were focusing on the nail and I recommended removal and I explained procedure allowing him to sign consent form.  I infiltrated the right fifth digit 60 mg like Marcaine mixture sterile prep done and using sterile instrumentation remove the fifth nail exposed matrix applied phenol three applications 30 seconds followed by alcohol lavage sterile dressing.  Gave instructions on soaks and reappoint  X-rays indicate complete collapse medial longitudinal arch right with the navicular bone which has moved in a medial direction and collapse of the talus

## 2020-03-25 ENCOUNTER — Other Ambulatory Visit: Payer: Self-pay | Admitting: Podiatry

## 2020-03-25 ENCOUNTER — Telehealth: Payer: Self-pay | Admitting: Podiatry

## 2020-03-25 DIAGNOSIS — M14671 Charcot's joint, right ankle and foot: Secondary | ICD-10-CM

## 2020-03-25 NOTE — Telephone Encounter (Signed)
Called patient to reschedule the appointment, patient stated he has called the nurse line and has not received call back yet regarding getting new boot. I informed patient he was scheduled for 03/28/20 but that appointment needed to be rescheduled due to possible bad weather. Patient stated he did not want to wait for an appointment and that was unacceptable. I offered to check on boot and patient stated he would only be able to come in Monday. I informed patient that we were being proactive and trying to reschedule patient due to chance of inclement weather. Patient was still unhappy, he decided to cancel appointment and then stated he would figure it out on his own

## 2020-03-28 ENCOUNTER — Ambulatory Visit: Payer: BC Managed Care – PPO | Admitting: Podiatry

## 2020-04-14 ENCOUNTER — Encounter: Payer: Self-pay | Admitting: Podiatry

## 2020-04-14 ENCOUNTER — Ambulatory Visit: Payer: BC Managed Care – PPO | Admitting: Podiatry

## 2020-04-14 ENCOUNTER — Other Ambulatory Visit: Payer: Self-pay

## 2020-04-14 DIAGNOSIS — M14671 Charcot's joint, right ankle and foot: Secondary | ICD-10-CM

## 2020-04-14 DIAGNOSIS — E1142 Type 2 diabetes mellitus with diabetic polyneuropathy: Secondary | ICD-10-CM

## 2020-04-14 DIAGNOSIS — N529 Male erectile dysfunction, unspecified: Secondary | ICD-10-CM | POA: Diagnosis not present

## 2020-04-14 DIAGNOSIS — M2141 Flat foot [pes planus] (acquired), right foot: Secondary | ICD-10-CM | POA: Diagnosis not present

## 2020-04-14 DIAGNOSIS — F909 Attention-deficit hyperactivity disorder, unspecified type: Secondary | ICD-10-CM | POA: Diagnosis not present

## 2020-04-14 DIAGNOSIS — M1460 Charcot's joint, unspecified site: Secondary | ICD-10-CM | POA: Diagnosis not present

## 2020-04-14 NOTE — Patient Instructions (Addendum)
Stay off the foot as much as possible. Wear the boot at all time when up and moving about. I recommend getting and using a rolling knee scooter from a medical supply store to get around easier without walking    Neuropathic Arthropathy Neuropathic arthropathy is a condition that results from poor circulation and numbness in the foot and ankle. Poor blood supply and numbness can cause foot or ankle injuries that are too small to be noticed or treated (microtrauma). You may continue to walk on your damaged foot and make the condition worse. Poor circulation can also cause poor healing and bone weakness. Over time, this condition can lead to:  Broken bones.  Bone infection.  Joint damage or dislocation.  Deformities, such as a collapsed foot arch.  Disability of the foot or ankle. Neuropathic arthropathy is also called Charcot arthropathy or Charcot foot. What are the causes? This condition may be caused by any disease that damages nerves and blood vessels of the foot and ankle. Diabetes is the most common cause. In this case, neuropathic arthropathy may also be called diabetic foot.  Less common causes of this condition include:  Polio.  Leprosy.  Syphilis.  Spinal cord damage.  Long-term (chronic) alcoholism. What are the signs or symptoms? Early symptoms of this condition include:  Foot swelling without a known injury and with very little pain.  Warmth and redness. Later symptoms may include:  Foot deformity.  Ankle instability.  Foot sores (ulcers). How is this diagnosed? This condition may be diagnosed based on:  Your symptoms and medical history.  A physical exam.  Tests, such as: ? Blood tests to check for signs of infection or poorly controlled diabetes. ? X-rays of the ankle and foot to look for bone damage. ? Imaging tests to check for soft tissue or joint damage, such as an MRI or ultrasound. ? A bone scan to check for a bone infection. How is this  treated? Treatment depends on the severity of the damage. If the damage is minor, treatment may include:  Wearing a brace, boot, or cast to protect and support the foot and ankle.  Using an assistive device such as a walker, a knee walker, a wheelchair, or crutches to keep weight off the foot so it can heal. If you have a bone or soft tissue infection, you may receive antibiotics. More severe cases may require surgery to repair fractures, remove diseased bone or tissues, or reconstruct the foot or ankle. After treatment, you may need to wear a special cushioned shoe or boot for protection. Follow these instructions at home: If you have a brace, boot, or shoe:  Wear it as told by your health care provider. Remove it only as told by your health care provider.  Loosen it if your toes tingle, become numb, or turn cold and blue.  Keep it clean and dry. If you have a cast:  Do not put pressure on any part of the cast until it is fully hardened. This may take several hours.  Do not stick anything inside the cast to scratch your skin. Doing that increases your risk of infection.  Check the skin around the cast every day. Tell your health care provider about any concerns.  You may put lotion on dry skin around the edges of the cast. Do not put lotion on the skin underneath the cast.  Keep it clean and dry. Bathing If you have a cast, brace, boot, or shoe that is not waterproof:  Do not let it get wet.  Cover it with a watertight covering when you take a bath or shower. Activity  Return to your normal activities as told by your health care provider. Ask your health care provider what activities are safe for you.  Avoid sitting for a long time without moving. Get up to take short walks every 1-2 hours. This is important to improve blood flow and breathing. Ask for help if you feel weak or unsteady.  Do not use the injured limb to support your body weight until your health care provider  says that you can. Use a walker, a knee walker, a wheelchair, or crutches as told by your health care provider.   General instructions  Check your feet every day for any redness, warmth, swelling, or ulceration.  Do not walk barefooted. This could lead to injuries to your foot. Wear well-fitted footwear.  Take over-the-counter and prescription medicines only as told by your health care provider.  If you were prescribed an antibiotic medicine, take it as told by your health care provider. Do not stop using the antibiotic even if you start to feel better.  Do not use any products that contain nicotine or tobacco, such as cigarettes, e-cigarettes, and chewing tobacco. If you need help quitting, ask your health care provider.  Keep all follow-up visits as told by your health care provider. This is important.      Contact a health care provider if you have:  A fever or chills.  Any new or worsening symptoms, such as redness, discharge, swelling, warmth, or ulcers on your foot.  Any problems with your cast, brace, or boot. Summary  Neuropathic arthropathy is a condition that results from poor circulation and numbness in the foot and ankle.  Poor blood supply and numbness can cause very small injuries (microtrauma) to your foot or ankle. Continuing to walk on your damaged foot can make the condition worse.  Any disease that damages nerves and blood vessels of the foot and ankle can cause neuropathic arthropathy. Diabetes is the most common cause.  Foot swelling without a known injury is the most common early symptom.  If the damage is minor, treatment may include wearing a brace, boot, or cast and using a walker, a knee walker, a wheelchair, or crutches. This information is not intended to replace advice given to you by your health care provider. Make sure you discuss any questions you have with your health care provider. Document Revised: 09/17/2018 Document Reviewed:  08/12/2018 Elsevier Patient Education  2021 [employer].

## 2020-04-17 ENCOUNTER — Encounter: Payer: Self-pay | Admitting: Podiatry

## 2020-04-17 NOTE — Progress Notes (Signed)
Subjective:  Patient ID: Derek Prince, male    DOB: 1975/07/19,  MRN: [id number]  Chief Complaint  Patient presents with  . Foot Pain    Right foot follow up    45 y.o. male presents with the above complaint. History confirmed with patient.  He is referred to me by Dr. Charlsie Merles for consultation for Charcot arthropathy of the right foot.  This began in July after developing pain and swelling in his ankle following gastric bypass surgery.  He lost 100 pounds since his surgery.  He presents today in a CAM boot.  Objective:  Physical Exam: warm, good capillary refill, no trophic changes or ulcerative lesions and normal DP and PT pulses.   Right foot and ankle is edematous and has some pain around the ankle and subtalar joints, minimal range of motion   Study Result  Narrative & Impression  CLINICAL DATA:  Right foot pain and swelling for 1 month without known injury.  EXAM: RIGHT FOOT COMPLETE - 3+ VIEW  COMPARISON:  None.  FINDINGS: Moderately displaced fracture is seen involving the superior portion of the anterior talus. Mildly displaced fracture is also seen involving the superior portion of 1 of the cuneiform bones, best seen on the lateral radiograph. Overlying soft tissue swelling is noted.  IMPRESSION: Moderately displaced fracture involving the superior portion of the anterior talus. Mildly displaced fracture involving 1 of the cuneiform bones.   Electronically Signed   By: Lupita Raider M.D.   On: 09/15/2019 16:20   Study Result  Narrative & Impression  CLINICAL DATA:  Pain and swelling for 1 month. Complex fractures seen on recent radiographs.  EXAM: CT OF THE RIGHT FOOT WITHOUT CONTRAST  TECHNIQUE: Multidetector CT imaging of the right foot was performed according to the standard protocol. Multiplanar CT image reconstructions were also generated.  COMPARISON:  Radiographs 09/15/2019  FINDINGS: Complex comminuted and severely  displaced fractures of the navicular bone. The navicular bone is fractured down the middle and displaced medially and laterally. Numerous comminuted fracture fragments are noted. Because of this the talus has a vertical orientation and is partially articulating with the cuneiforms instead of the navicular bone.  No definite fracture of the talus is identified. There are dorsal avulsion type fractures involving the middle and lateral cuneiforms. The medial cuneiform appears intact. Fracture fragments adjacent to the base of the medial cuneiform are likely fragments of the navicular bone.  Complex comminuted intra-articular fracture of the cuboid is also noted. There appears to be a die punch type component proximally at the calcaneocuboid joint. The cuboid articulation with the fourth and fifth metatarsal bases is maintained and the other tarsal metatarsal joints are maintained. I do not see any definite metatarsal bone fractures.  The calcaneus is intact. The talocalcaneal joints are maintained. The sinus tarsi is somewhat narrowed medially because of the vertical orientation of the talus.  The tibiotalar joint is maintained.  Grossly by CT the medial and lateral ankle tendons and ligaments are intact. The Achilles tendon is normal. The plantar fascia is intact.  IMPRESSION: 1. Complex comminuted and severely displaced fractures of the navicular bone. 2. Complex comminuted intra-articular fracture of the cuboid. 3. Dorsal avulsion type fractures involving the middle and lateral cuneiforms. 4. No definite metatarsal bone fractures are identified. 5. Grossly by CT the medial and lateral ankle tendons and ligaments are intact.   Electronically Signed   By: Rudie Meyer M.D.   On: 09/22/2019 12:05    Assessment:  1. Charcot's joint of right ankle   2. Diabetic polyneuropathy associated with type 2 diabetes mellitus (HCC)   3. Acquired rigid pes planus of right  foot      Plan:  Patient was evaluated and treated and all questions answered.  Had a long discussion with him today regarding the etiology and treatment options for Charcot arthropathy.  He is already begun researching some of this himself.  Currently has a CAM boot and I discussed with him that ideally a total contact cast would likely be the best treatment for him.  Says he would like to think this over not be able to do it today.  Emphasize importance of nonweightbearing he will look into getting a rolling knee scooter to stay off of it.  I discussed with him that even though he has lost weight and is effectively no longer diabetic after his bypass, he will always have the risk of neuropathic arthropathy now that he has had one flare.  Our goal here will be to provide a plantigrade foot and prevent ulceration.  Currently this is the case.  He does have significant talar declination and collapse of the midfoot on his radiographs.  Hopeful to treat nonoperatively and to coalesce and be able to fit into bracing.  Next visit we will consider casting.  He needs x-rays at next visit as well.  Return in about 4 weeks (around 05/12/2020) for check x-rays, possible casting.

## 2020-04-19 ENCOUNTER — Other Ambulatory Visit: Payer: Self-pay

## 2020-04-19 ENCOUNTER — Encounter: Payer: BC Managed Care – PPO | Attending: Surgery | Admitting: Skilled Nursing Facility1

## 2020-04-19 DIAGNOSIS — E669 Obesity, unspecified: Secondary | ICD-10-CM | POA: Diagnosis not present

## 2020-04-19 DIAGNOSIS — E1165 Type 2 diabetes mellitus with hyperglycemia: Secondary | ICD-10-CM

## 2020-04-19 DIAGNOSIS — E039 Hypothyroidism, unspecified: Secondary | ICD-10-CM | POA: Diagnosis not present

## 2020-04-19 NOTE — Progress Notes (Signed)
Follow-up visit:  Post-Operative  RYGB Surgery   Primary concerns today: Post-operative Bariatric Surgery Nutrition Management 6 Month Post-Op Class   Anthropometrics  Surgery date: 08/18/2019 Surgery type: RYGB Start weight at [facility]: 341 Weight today: pt declined  Body Composition Scale 10/13/2019 04/19/2020  Weight  lbs 298.1 253.8  Total Body Fat  % 32.5 26.8     Visceral Fat 23 17  Fat-Free Mass  % 67.4 73.1     Total Body Water  % 48.4 54.1     Muscle-Mass  lbs 56.2 47.8  BMI 37.2 31.6  Body Fat Displacement ---         Torso  lbs 60.2 42.2        Left Leg  lbs 12 8.4        Right Leg  lbs 12 8.4        Left Arm  lbs 6 4.2        Right Arm  lbs 6 4.2    Pt arrives with a  Boot stating he will have to wear it for a while stating his doctor tells him they usually just amputate it form his neuropathy leading to collapsed arch.   Pt state she was advised his iron was low and needs a supplement pt states he has not started that yet. Pt states he was taking his calcium close to his thyroid medication which messed up his labs.  Pt states he realizes he was eating too big of portions and eating too many granola bars so he started increasing his non starchy vegetables to fill the hunger void and has been eating less meat and eating more fish. Pt states he stays away from starchy foods. Pt states he thinks he hurt his foot doing the eliptical stating the bike was better stating his doctor really does not want him to do much of anything for moving. Pt states his main struggle is when he does not have anything to do at night he eats.  Pt states he does have trouble with motivation.  24 hr recall:  Breakfast: 2 instant grits packs + 2 eggs Snack: Lunch: 2-3 granola bars Snack: Dinner: fish or ground beef + onion + pepper or chicken + spinach Snack: chicken quesadilla  Beverages: water + flavoring, coffee  Goals: Make half your fluids plain water Try YouTube workouts typing in non  weight bearing workouts to help go to bed sooner and reduce after dinner non hunger eating  Reduce granola bars for lunch and have more of a balanced meal Do not take your iron supplement calcium or multivitamin near one another or your thyroid  Fluid intake: adequate   Medications: See List Supplementation: appropriate    Using straws: no Drinking while eating: no Having you been chewing well: yes Chewing/swallowing difficulties: no Changes in vision: no Changes to mood/headaches: no Hair loss/Cahnges to skin/Changes to nails: no Any difficulty focusing or concentrating: no Sweating: no Dizziness/Lightheaded: no Palpitations: no  Carbonated beverages: no N/V/D/C/GAS: no Abdominal Pain: no Dumping syndrome: no  Recent physical activity:  ADL's  Progress Towards Goal(s):  In Progress  Auditory Hands on  Demonstrated degree of understanding via:  Teach Back   Monitoring/Evaluation:  Dietary intake, exercise, and body weight.

## 2020-05-12 ENCOUNTER — Ambulatory Visit: Payer: BC Managed Care – PPO | Admitting: Podiatry

## 2020-05-26 ENCOUNTER — Ambulatory Visit (INDEPENDENT_AMBULATORY_CARE_PROVIDER_SITE_OTHER): Payer: BC Managed Care – PPO

## 2020-05-26 ENCOUNTER — Ambulatory Visit (INDEPENDENT_AMBULATORY_CARE_PROVIDER_SITE_OTHER): Payer: BC Managed Care – PPO | Admitting: Podiatry

## 2020-05-26 ENCOUNTER — Other Ambulatory Visit: Payer: Self-pay

## 2020-05-26 ENCOUNTER — Encounter: Payer: Self-pay | Admitting: Podiatry

## 2020-05-26 DIAGNOSIS — M14671 Charcot's joint, right ankle and foot: Secondary | ICD-10-CM

## 2020-05-26 DIAGNOSIS — E1142 Type 2 diabetes mellitus with diabetic polyneuropathy: Secondary | ICD-10-CM

## 2020-05-26 DIAGNOSIS — M2141 Flat foot [pes planus] (acquired), right foot: Secondary | ICD-10-CM

## 2020-05-26 NOTE — Progress Notes (Signed)
Subjective:  Patient ID: Derek Prince, male    DOB: 1975/05/03,  MRN: [id number]  Chief Complaint  Patient presents with  . Foot Pain    PT stated that some days he has pain and then some days he does not    45 y.o. male returns with the above complaint. History confirmed with patient.  Says overall seems to be doing somewhat better, less swelling  Objective:  Physical Exam: warm, good capillary refill, no trophic changes or ulcerative lesions and normal DP and PT pulses.   Less edema today and warmth around the ankle and subtalar joints, no appreciable increase in deformity  Study Result  Narrative & Impression  CLINICAL DATA:  Right foot pain and swelling for 1 month without known injury.  EXAM: RIGHT FOOT COMPLETE - 3+ VIEW  COMPARISON:  None.  FINDINGS: Moderately displaced fracture is seen involving the superior portion of the anterior talus. Mildly displaced fracture is also seen involving the superior portion of 1 of the cuneiform bones, best seen on the lateral radiograph. Overlying soft tissue swelling is noted.  IMPRESSION: Moderately displaced fracture involving the superior portion of the anterior talus. Mildly displaced fracture involving 1 of the cuneiform bones.   Electronically Signed   By: Lupita Raider M.D.   On: 09/15/2019 16:20   Study Result  Narrative & Impression  CLINICAL DATA:  Pain and swelling for 1 month. Complex fractures seen on recent radiographs.  EXAM: CT OF THE RIGHT FOOT WITHOUT CONTRAST  TECHNIQUE: Multidetector CT imaging of the right foot was performed according to the standard protocol. Multiplanar CT image reconstructions were also generated.  COMPARISON:  Radiographs 09/15/2019  FINDINGS: Complex comminuted and severely displaced fractures of the navicular bone. The navicular bone is fractured down the middle and displaced medially and laterally. Numerous comminuted fracture fragments  are noted. Because of this the talus has a vertical orientation and is partially articulating with the cuneiforms instead of the navicular bone.  No definite fracture of the talus is identified. There are dorsal avulsion type fractures involving the middle and lateral cuneiforms. The medial cuneiform appears intact. Fracture fragments adjacent to the base of the medial cuneiform are likely fragments of the navicular bone.  Complex comminuted intra-articular fracture of the cuboid is also noted. There appears to be a die punch type component proximally at the calcaneocuboid joint. The cuboid articulation with the fourth and fifth metatarsal bases is maintained and the other tarsal metatarsal joints are maintained. I do not see any definite metatarsal bone fractures.  The calcaneus is intact. The talocalcaneal joints are maintained. The sinus tarsi is somewhat narrowed medially because of the vertical orientation of the talus.  The tibiotalar joint is maintained.  Grossly by CT the medial and lateral ankle tendons and ligaments are intact. The Achilles tendon is normal. The plantar fascia is intact.  IMPRESSION: 1. Complex comminuted and severely displaced fractures of the navicular bone. 2. Complex comminuted intra-articular fracture of the cuboid. 3. Dorsal avulsion type fractures involving the middle and lateral cuneiforms. 4. No definite metatarsal bone fractures are identified. 5. Grossly by CT the medial and lateral ankle tendons and ligaments are intact.   Electronically Signed   By: Rudie Meyer M.D.   On: 09/22/2019 12:05     New foot and ankle radiographs taken today show no appreciable change in alignment, appears to have some new consolidation of the fracture fragments Assessment:   1. Charcot's joint of right ankle  Plan:  Patient was evaluated and treated and all questions answered.  We discussed further treatment options for Charcot  neuropathic arthropathy.  He appears to be moving into a coalescence phase as the Charcot seems to be calming down.  We discussed total contact casting and how this would help to accelerate this process.  He states that he would not be able to get to and from work with a cast on his right foot which I understand.  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  He does need something more significant in the CAM boot to allow him to continue to work and weight-bear.  I think we should have him fitted for a CROW device to accommodate the Charcot deformity and prevent him from further collapse.  This would be something he could work in.  Prescription for this was given to him to take to [facility] for this  Return in about 1 month (around 06/26/2020) for follow up on right foot Charcot, new x-rays.

## 2020-05-31 ENCOUNTER — Ambulatory Visit: Payer: BC Managed Care – PPO | Admitting: Skilled Nursing Facility1

## 2020-06-03 DIAGNOSIS — M14671 Charcot's joint, right ankle and foot: Secondary | ICD-10-CM | POA: Diagnosis not present

## 2020-06-03 DIAGNOSIS — M79671 Pain in right foot: Secondary | ICD-10-CM | POA: Diagnosis not present

## 2020-06-29 ENCOUNTER — Encounter: Payer: BC Managed Care – PPO | Attending: Surgery | Admitting: Skilled Nursing Facility1

## 2020-06-29 ENCOUNTER — Other Ambulatory Visit: Payer: Self-pay

## 2020-06-29 DIAGNOSIS — E669 Obesity, unspecified: Secondary | ICD-10-CM | POA: Diagnosis not present

## 2020-06-29 DIAGNOSIS — E1165 Type 2 diabetes mellitus with hyperglycemia: Secondary | ICD-10-CM | POA: Insufficient documentation

## 2020-06-29 NOTE — Progress Notes (Signed)
Follow-up visit:  Post-Operative  RYGB Surgery   Primary concerns today:  17-month post RYGB follow up   Anthropometrics  Surgery date: 08/18/2019 Surgery type: RYGB Start weight at [facility]: 341 Weight today: 257.3 lbs  Body Composition Scale 10/13/2019 04/19/2020 06/29/2020  Weight  lbs 298.1 253.8 257.3  Total Body Fat  % 32.5 26.8 27.3     Visceral Fat 23 17 17   Fat-Free Mass  % 67.4 73.1 72.6     Total Body Water  % 48.4 54.1 53.6     Muscle-Mass  lbs 56.2 47.8 48.5  BMI 37.2 31.6 32  Body Fat Displacement ---          Torso  lbs 60.2 42.2         Left Leg  lbs 12 8.4         Right Leg  lbs 12 8.4         Left Arm  lbs 6 4.2         Right Arm  lbs 6 4.2     Pt reports he stopped going to gym as he felt uncomfortable with his foot; however, he work out at home, with exercise bands, gets 12,000 steps per day and takes stairs, and also does body weight exercises. He feels better and like he has gained muscle mass.  He has tried yoga but can't run with his foot.    He reports he did try the iron supplement and likes them.  Pt reports he has bariatric surgeon visit tomorrow for scheduled follow up.  Pt reports he recognized an emotional eating episode and was able to stop it.  He also was able to manage a sweet craving by waiting it out until it passed. Pt reports he feels better about things and is coping better.  Pt reports he is willing to try yoga for neuropathy.  Pt reports he has been using exercise bands when the urge to eat hits at 11pm which is helpful.  24 hr recall:  Breakfast: coffee Snack: Lunch: cooked vegetables with grilled chicken thigh Snack: Dinner: fish and grits with cheese + hot sauce Snack: additional serving from dinner if still hungry  Beverages: water + flavoring, coffee, zero gatorade  Goals: Make half your fluids plain water Try yoga for neuropathy Do not take your iron supplement calcium or multivitamin near one another or your  thyroid Have fruit yogurt parfait for breakfast  Fluid intake: adequate   Medications: See List Supplementation: appropriate    Using straws: no Drinking while eating: no Having you been chewing well: yes Chewing/swallowing difficulties: no Changes in vision: no Changes to mood/headaches: no Hair loss/Cahnges to skin/Changes to nails: no Any difficulty focusing or concentrating: no Sweating: no Dizziness/Lightheaded: no Palpitations: no  Carbonated beverages: no N/V/D/C/GAS: no Abdominal Pain: no Dumping syndrome: no  Recent physical activity:  ADL's  Progress Towards Goal(s):  In Progress  Demonstrated degree of understanding via:  Teach Back   Monitoring/Evaluation:  Dietary intake, exercise, and body weight.

## 2020-06-30 ENCOUNTER — Ambulatory Visit: Payer: BC Managed Care – PPO | Admitting: Podiatry

## 2020-06-30 DIAGNOSIS — Z09 Encounter for follow-up examination after completed treatment for conditions other than malignant neoplasm: Secondary | ICD-10-CM | POA: Diagnosis not present

## 2020-07-11 DIAGNOSIS — M1460 Charcot's joint, unspecified site: Secondary | ICD-10-CM | POA: Diagnosis not present

## 2020-07-11 DIAGNOSIS — E291 Testicular hypofunction: Secondary | ICD-10-CM | POA: Diagnosis not present

## 2020-07-11 DIAGNOSIS — Z9884 Bariatric surgery status: Secondary | ICD-10-CM | POA: Diagnosis not present

## 2020-07-11 DIAGNOSIS — E039 Hypothyroidism, unspecified: Secondary | ICD-10-CM | POA: Diagnosis not present

## 2020-07-11 DIAGNOSIS — F909 Attention-deficit hyperactivity disorder, unspecified type: Secondary | ICD-10-CM | POA: Diagnosis not present

## 2020-07-11 DIAGNOSIS — E785 Hyperlipidemia, unspecified: Secondary | ICD-10-CM | POA: Diagnosis not present

## 2020-07-11 DIAGNOSIS — R7309 Other abnormal glucose: Secondary | ICD-10-CM | POA: Diagnosis not present

## 2020-07-19 ENCOUNTER — Telehealth: Payer: Self-pay | Admitting: Skilled Nursing Facility1

## 2020-07-19 NOTE — Telephone Encounter (Signed)
Oh okay. You can take them both at the same time absorption should not be an issue just make sure to take them at least 2 hours from your calcium.   From: [name] [email or web address]>  Sent: Tuesday, Jul 19, 2020 10:01 AM To: [name] [email or web address]> Subject: Re: Iron supplement   *Caution - External email - see footer for warnings* I wasn't prescribed a dose.  I am using the one you recommend at 45mg .  My doctor's office just said take 2 of them.    from my iPhone   On Jul 19, 2020, at 9:42 AM, [name] [email or web address]> wrote: ?  Jul 21, 2020, What dose was prescribed?   From: [name] [email or web address]>  Sent: Monday, Jul 18, 2020 5:53 PM To: [name] [email or web address]> Subject: Re: Iron supplement    *Caution - External email - see footer for warnings* Hi Derek Prince,   I had some blood work done recently and my iron is still really low.  My doctor asked me to double the iron supplement to 2 a day.   Can I take two pills at 1 time or is this something that needs to be 2 hour from the last pill like the calcium pills?   The doctors office didn't know   Derek Prince

## 2020-08-10 DIAGNOSIS — Z9884 Bariatric surgery status: Secondary | ICD-10-CM | POA: Diagnosis not present

## 2020-08-10 DIAGNOSIS — E1165 Type 2 diabetes mellitus with hyperglycemia: Secondary | ICD-10-CM | POA: Diagnosis not present

## 2020-08-10 DIAGNOSIS — E039 Hypothyroidism, unspecified: Secondary | ICD-10-CM | POA: Diagnosis not present

## 2020-08-10 DIAGNOSIS — E1140 Type 2 diabetes mellitus with diabetic neuropathy, unspecified: Secondary | ICD-10-CM | POA: Diagnosis not present

## 2020-08-22 DIAGNOSIS — Z03818 Encounter for observation for suspected exposure to other biological agents ruled out: Secondary | ICD-10-CM | POA: Diagnosis not present

## 2020-08-22 DIAGNOSIS — J069 Acute upper respiratory infection, unspecified: Secondary | ICD-10-CM | POA: Diagnosis not present

## 2020-08-22 DIAGNOSIS — J029 Acute pharyngitis, unspecified: Secondary | ICD-10-CM | POA: Diagnosis not present

## 2020-08-22 DIAGNOSIS — R051 Acute cough: Secondary | ICD-10-CM | POA: Diagnosis not present

## 2020-08-22 DIAGNOSIS — R509 Fever, unspecified: Secondary | ICD-10-CM | POA: Diagnosis not present

## 2020-08-30 ENCOUNTER — Encounter: Payer: BC Managed Care – PPO | Attending: Surgery | Admitting: Skilled Nursing Facility1

## 2020-08-30 DIAGNOSIS — E1165 Type 2 diabetes mellitus with hyperglycemia: Secondary | ICD-10-CM | POA: Insufficient documentation

## 2020-08-30 DIAGNOSIS — E669 Obesity, unspecified: Secondary | ICD-10-CM | POA: Insufficient documentation

## 2021-01-09 DIAGNOSIS — E039 Hypothyroidism, unspecified: Secondary | ICD-10-CM | POA: Diagnosis not present

## 2021-01-09 DIAGNOSIS — F909 Attention-deficit hyperactivity disorder, unspecified type: Secondary | ICD-10-CM | POA: Diagnosis not present

## 2021-01-09 DIAGNOSIS — Z23 Encounter for immunization: Secondary | ICD-10-CM | POA: Diagnosis not present

## 2021-01-09 DIAGNOSIS — E1165 Type 2 diabetes mellitus with hyperglycemia: Secondary | ICD-10-CM | POA: Diagnosis not present

## 2021-01-09 DIAGNOSIS — Z9884 Bariatric surgery status: Secondary | ICD-10-CM | POA: Diagnosis not present

## 2021-01-09 DIAGNOSIS — E785 Hyperlipidemia, unspecified: Secondary | ICD-10-CM | POA: Diagnosis not present

## 2021-01-13 DIAGNOSIS — M14671 Charcot's joint, right ankle and foot: Secondary | ICD-10-CM | POA: Diagnosis not present

## 2021-04-11 DIAGNOSIS — F909 Attention-deficit hyperactivity disorder, unspecified type: Secondary | ICD-10-CM | POA: Diagnosis not present

## 2021-04-19 IMAGING — US US EXTREM LOW VENOUS*R*
1 series · 13 of 24 positions shown · non-contrast
Comparison: None.

CLINICAL DATA: Lower extremity swelling

EXAM:
RIGHT LOWER EXTREMITY VENOUS DOPPLER ULTRASOUND
TECHNIQUE: Gray-scale sonography with compression, as well as color and duplex
ultrasound, were performed to evaluate the deep venous system(s)
from the level of the common femoral vein through the popliteal and
proximal calf veins.

[Series 1: us extrem low venous*right* · 13 of 36 slices shown]
[im 1/36]
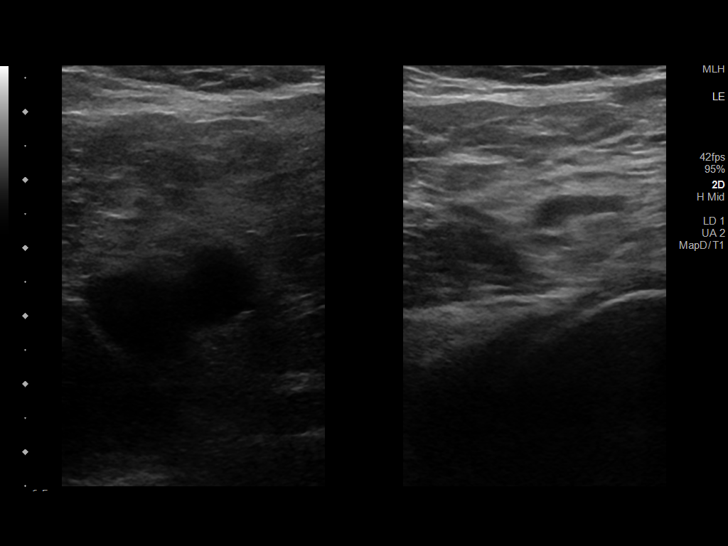
[im 4/36]
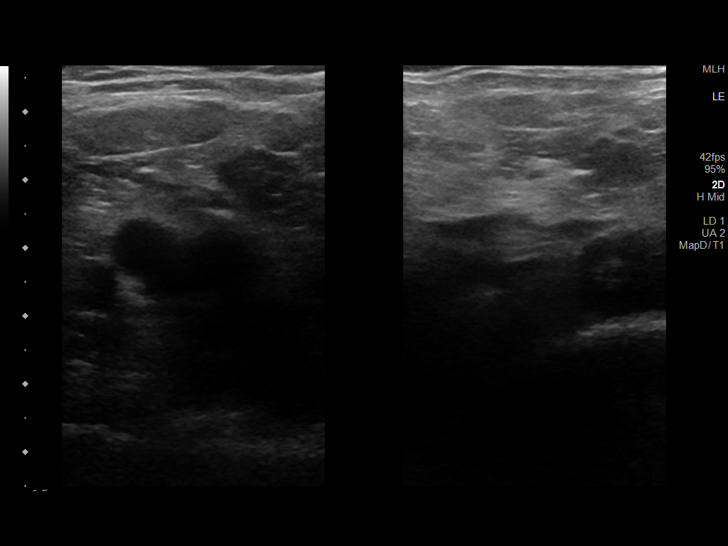
[im 7/36]
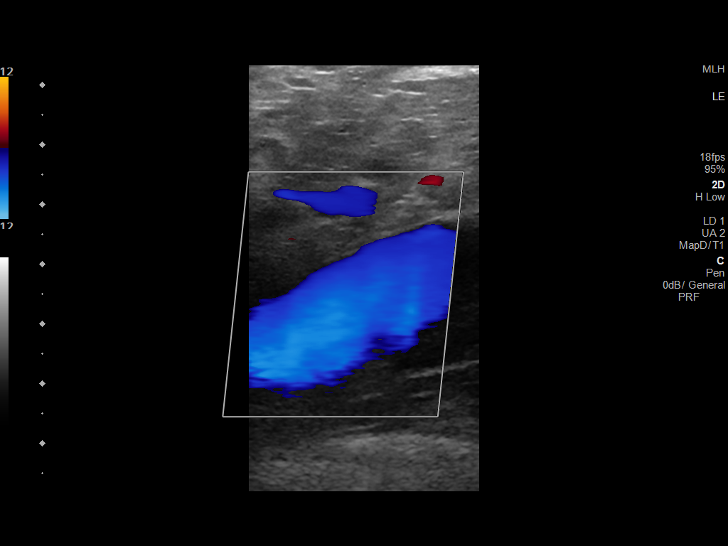
[im 10/36]
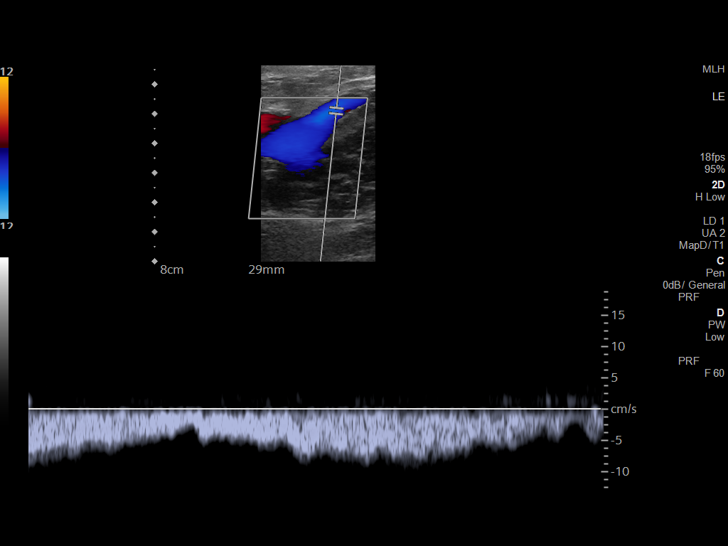
[im 13/36]
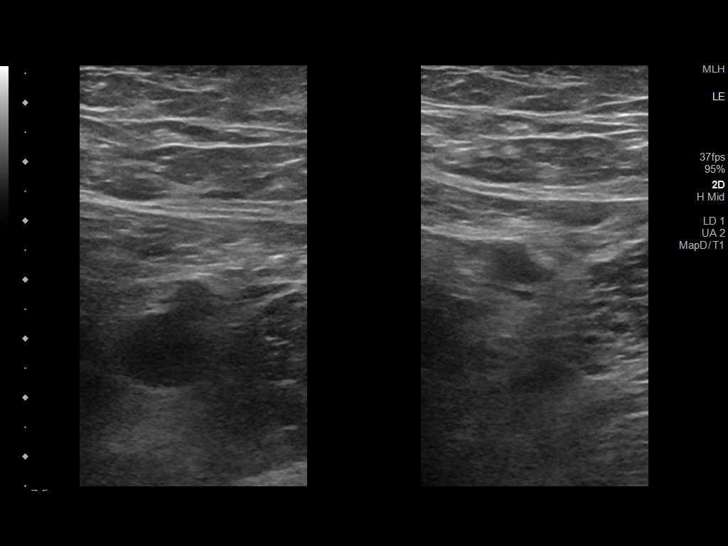
[im 16/36]
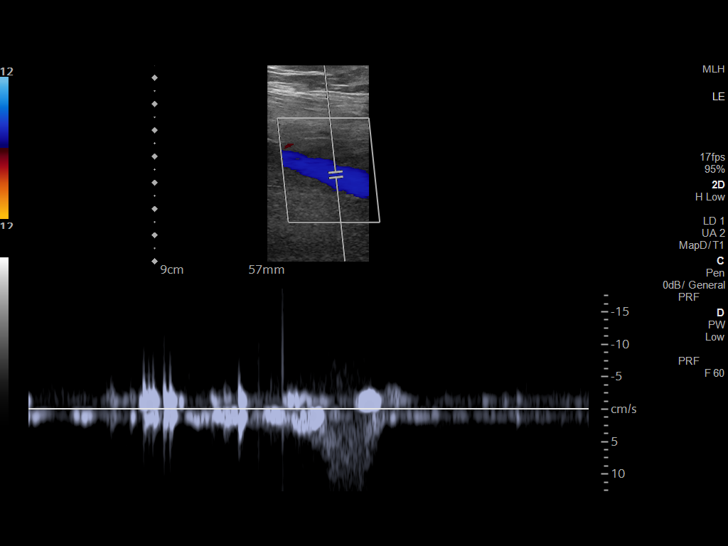
[im 19/36]
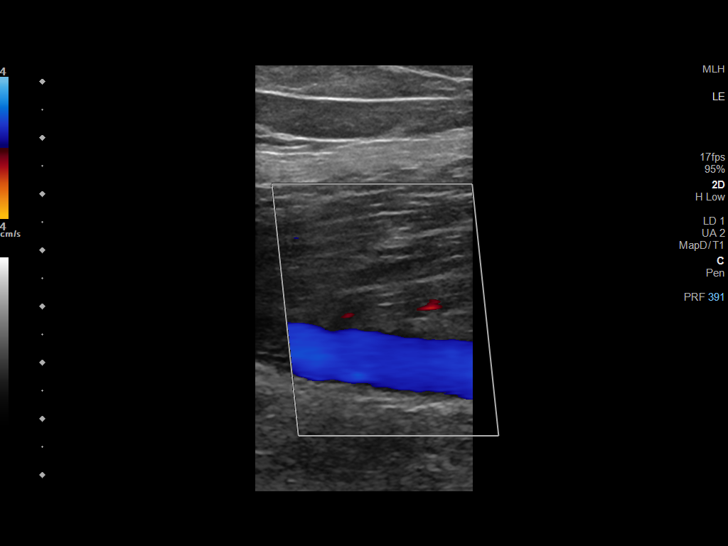
[im 20/36]
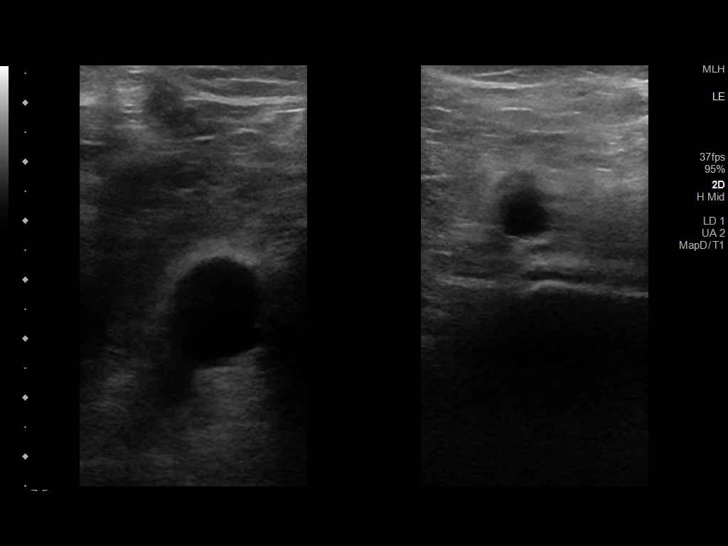
[im 23/36]
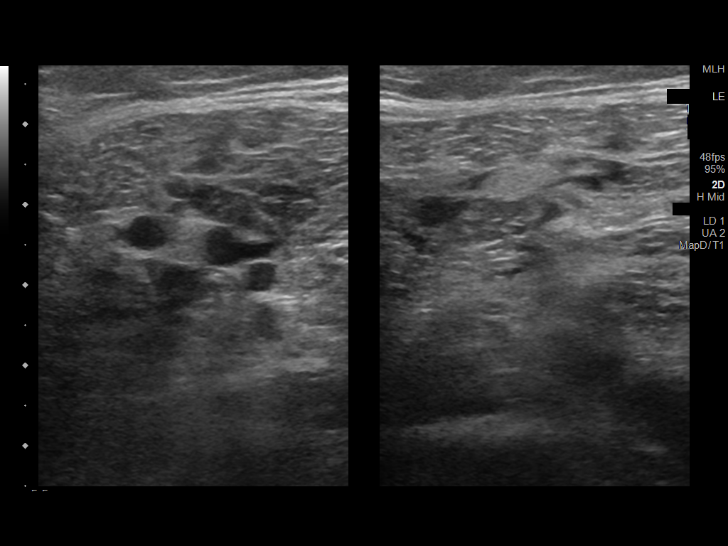
[im 26/36]
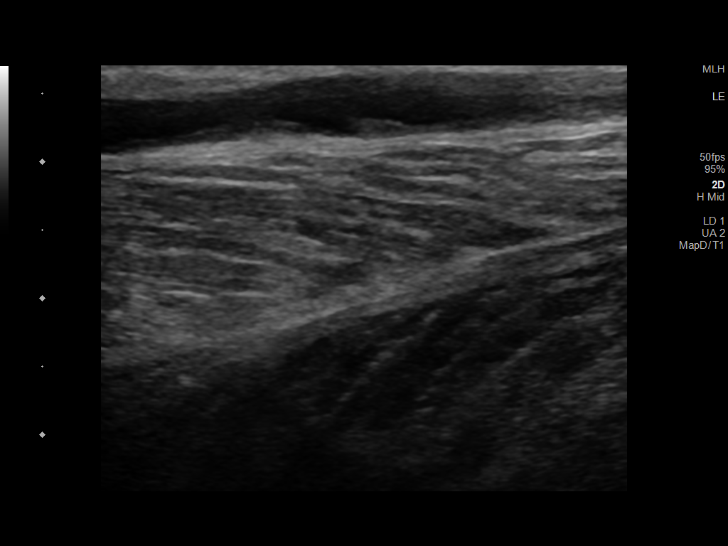
[im 29/36]
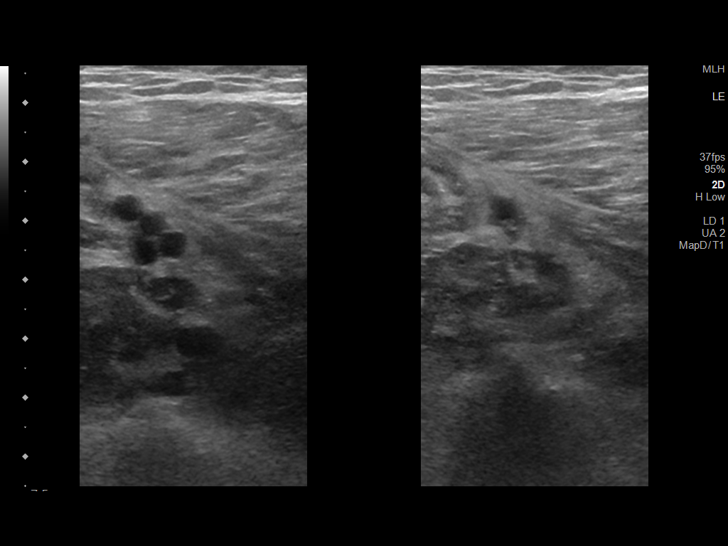
[im 32/36]
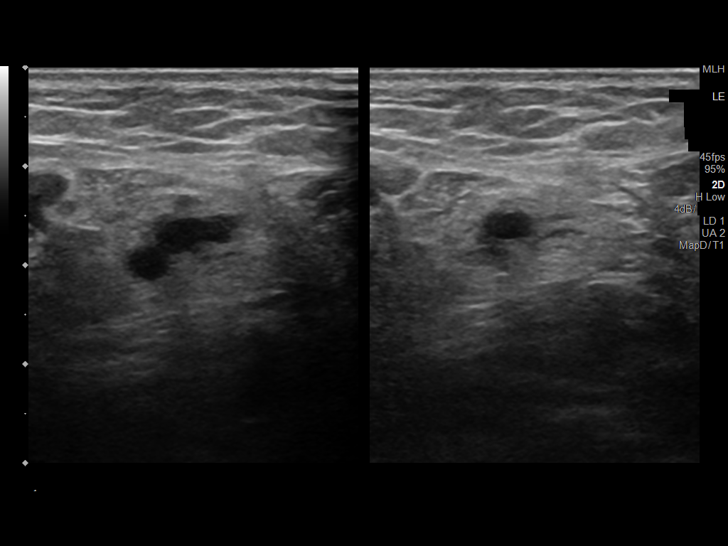
[im 36/36]
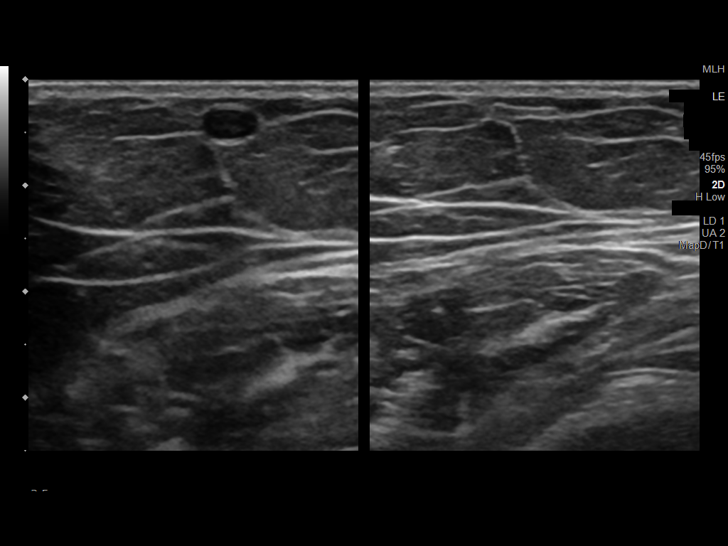

[13 of 24 positions shown; findings below may reference images not displayed]

FINDINGS: VENOUS

Normal compressibility of the common femoral, superficial femoral,
and popliteal veins, as well as the visualized calf veins.
Visualized portions of profunda femoral vein and great saphenous
vein unremarkable. No filling defects to suggest DVT on grayscale or
color Doppler imaging. Doppler waveforms show normal direction of
venous flow, normal respiratory plasticity and response to
augmentation.

Limited views of the contralateral common femoral vein are
unremarkable.

OTHER

Incidental note is made of eccentric wall thickening in the small
saphenous vein along the posterior aspect of the calf. This renders
the vessel only partially compressible. There is evidence of color
flow on color Doppler imaging. Findings are most consistent with
partially recanalized chronic superficial thrombus.

Limitations: none
IMPRESSION: 1. Negative for deep venous thrombosis.
2. Positive for partially recanalized subacute to chronic
superficial thrombosis within the small saphenous vein along the
posterior calf.

## 2021-04-23 IMAGING — CR DG FOOT COMPLETE 3+V*R*
3 series · 3 of 3 positions shown · non-contrast
Comparison: None.

CLINICAL DATA: Right foot pain and swelling for 1 month without
known injury.

EXAM:
RIGHT FOOT COMPLETE - 3+ VIEW

[x foot ap right]
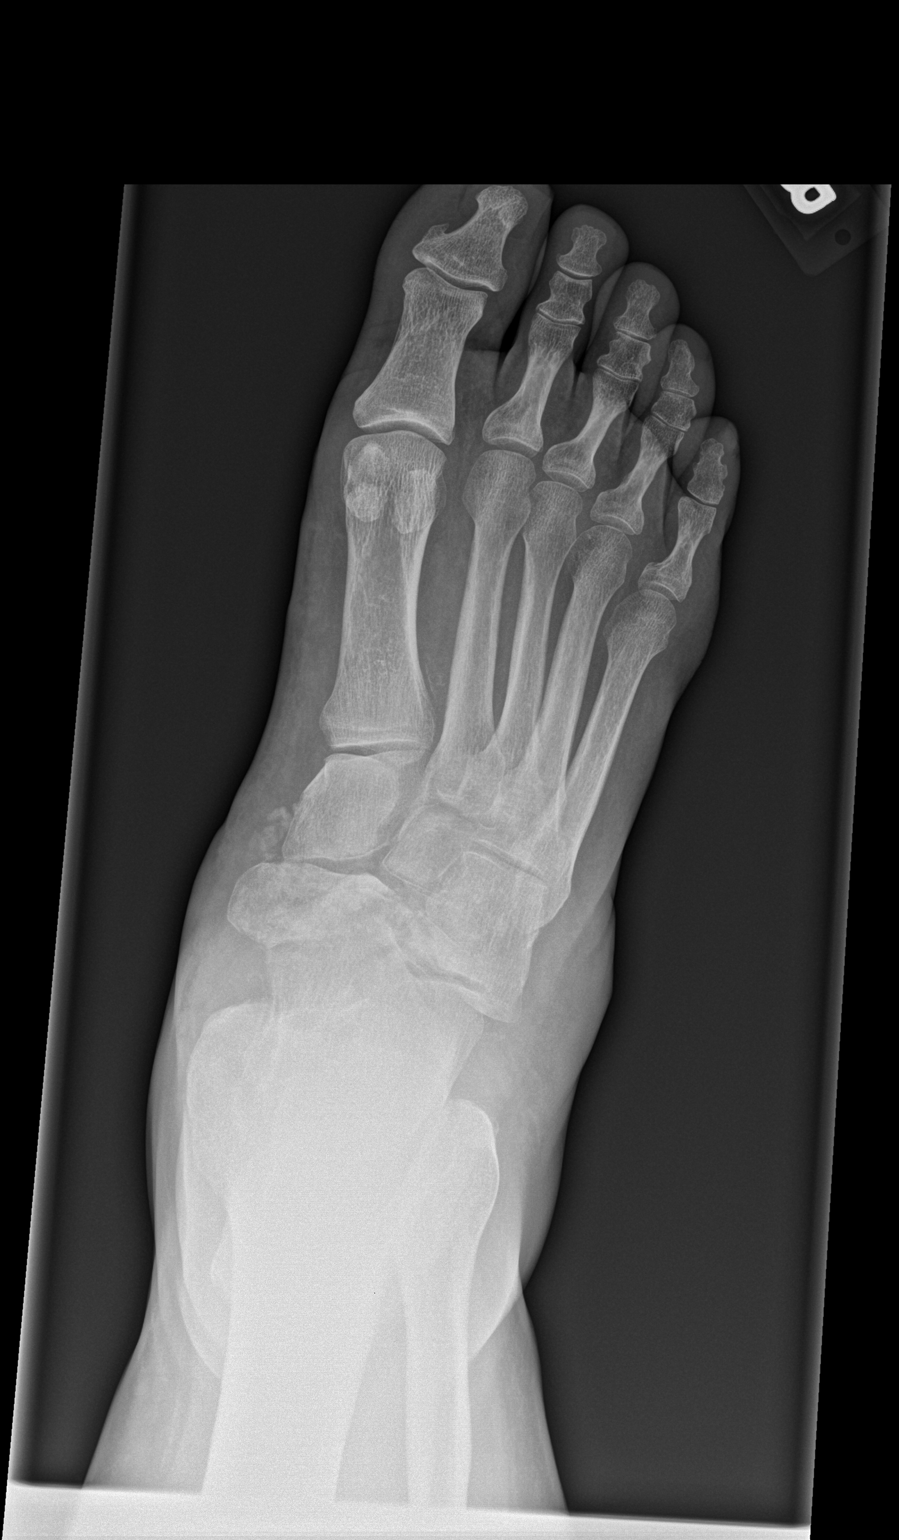

[x foot obl right]
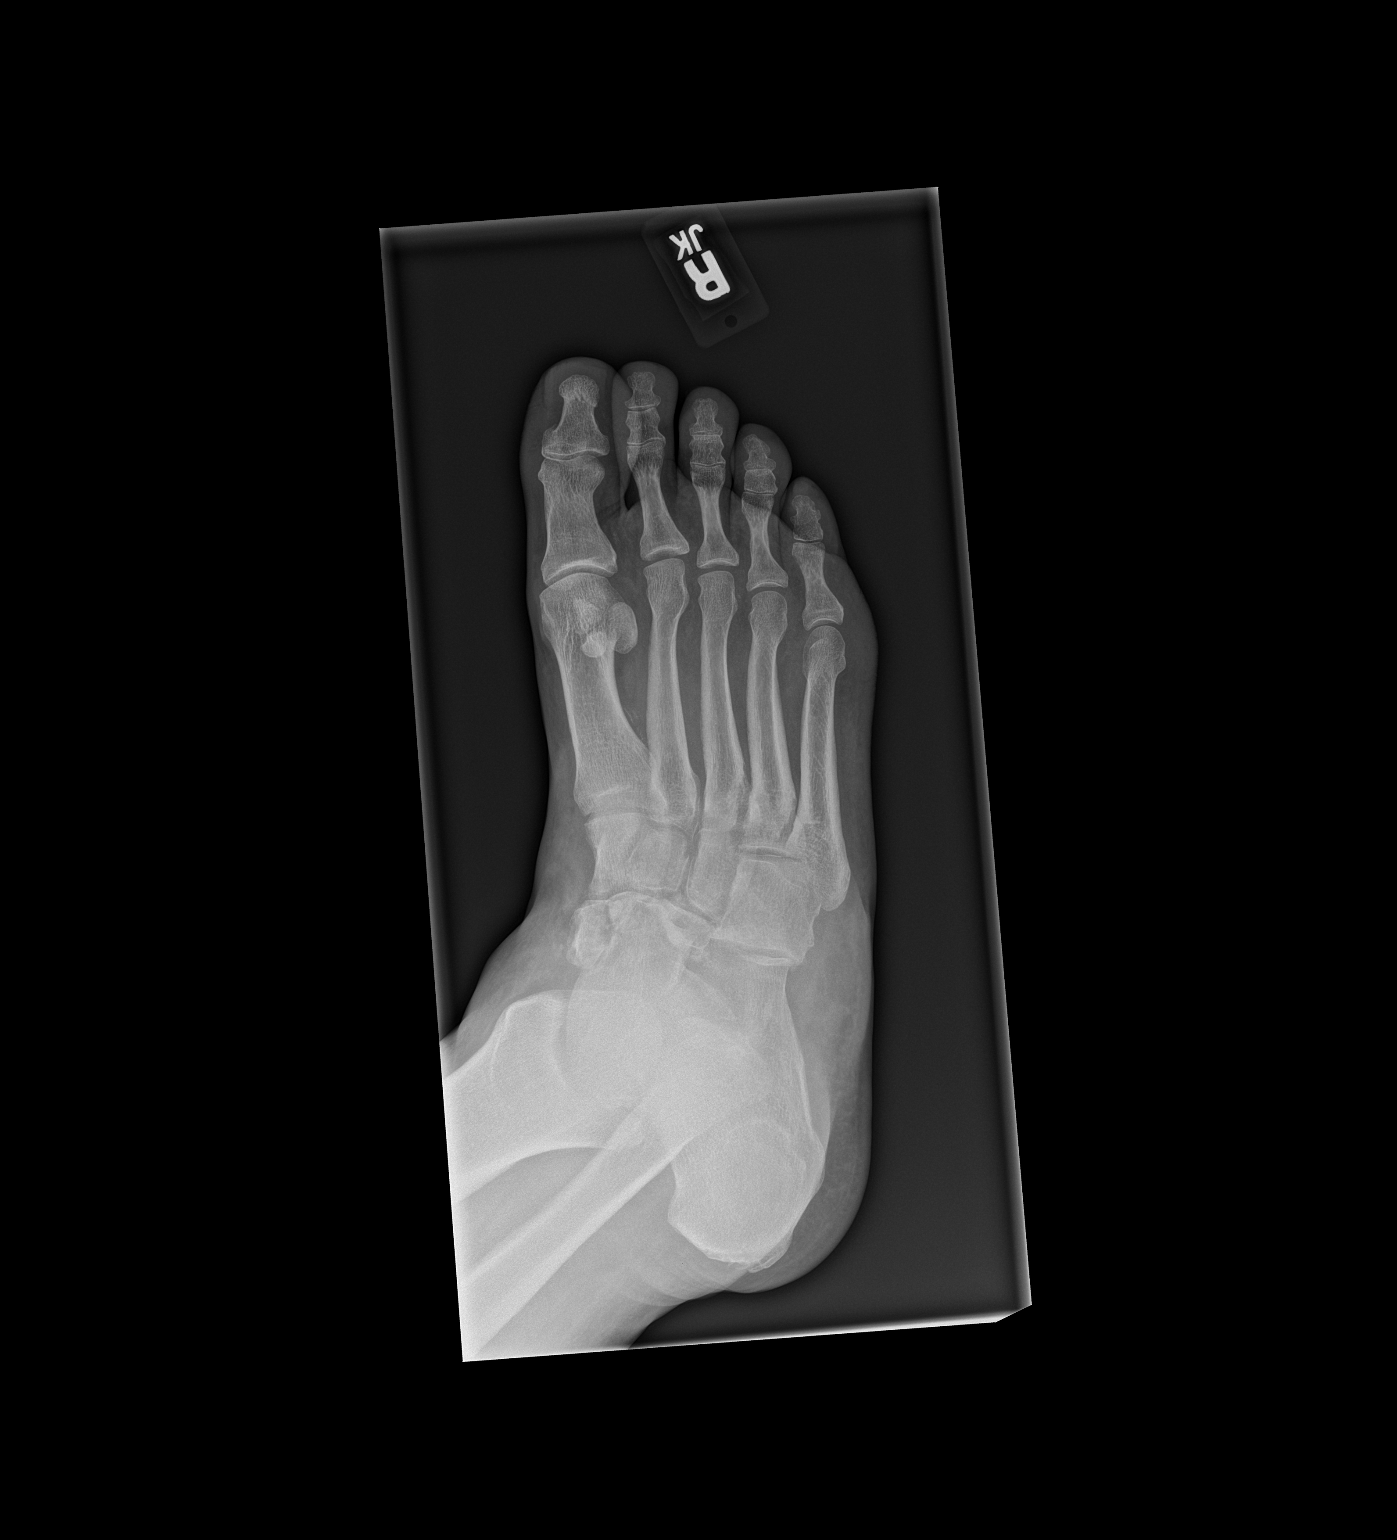

[x foot lat right]
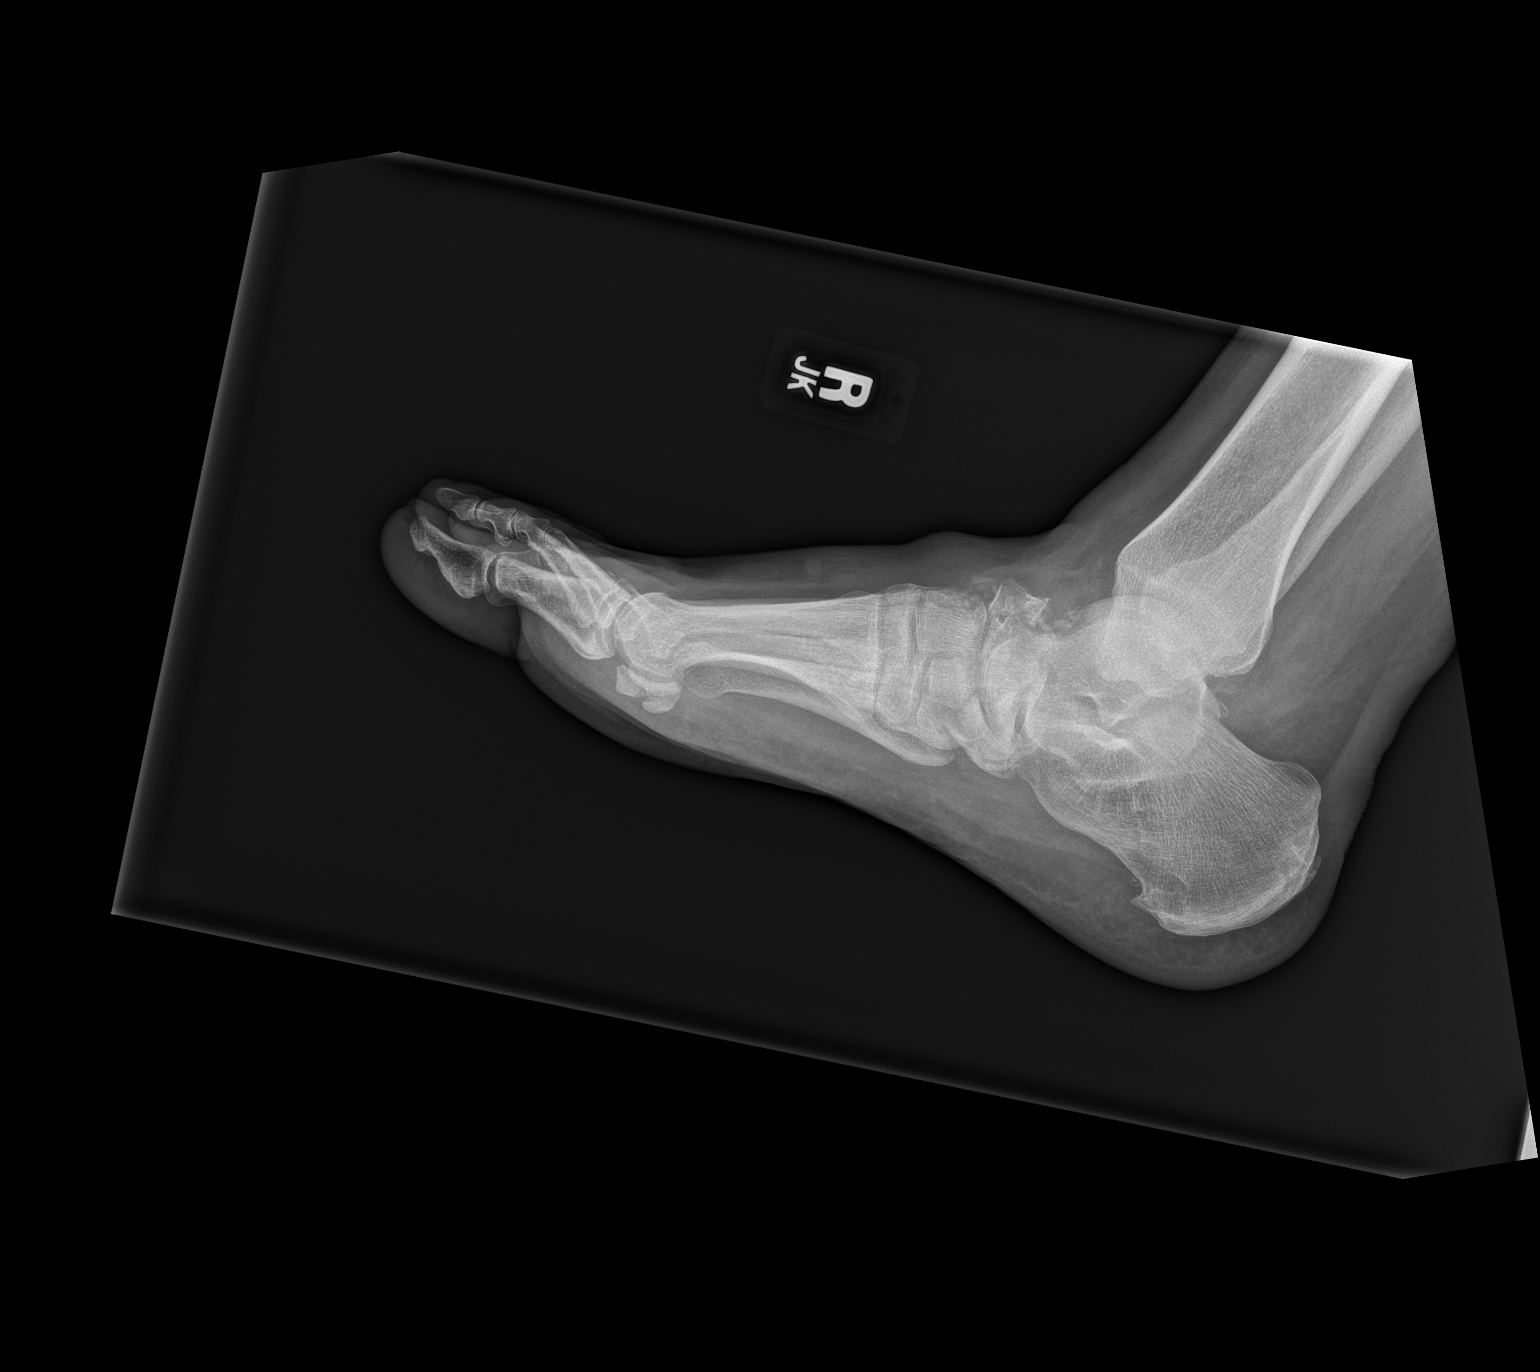

[3 of 3 positions shown; findings below may reference images not displayed]

FINDINGS: Moderately displaced fracture is seen involving the superior portion
of the anterior talus. Mildly displaced fracture is also seen
involving the superior portion of 1 of the cuneiform bones, best
seen on the lateral radiograph. Overlying soft tissue swelling is
noted.
IMPRESSION: Moderately displaced fracture involving the superior portion of the
anterior talus. Mildly displaced fracture involving 1 of the
cuneiform bones.

## 2021-07-11 DIAGNOSIS — E1165 Type 2 diabetes mellitus with hyperglycemia: Secondary | ICD-10-CM | POA: Diagnosis not present

## 2021-07-11 DIAGNOSIS — F909 Attention-deficit hyperactivity disorder, unspecified type: Secondary | ICD-10-CM | POA: Diagnosis not present

## 2021-07-11 DIAGNOSIS — E785 Hyperlipidemia, unspecified: Secondary | ICD-10-CM | POA: Diagnosis not present

## 2021-07-11 DIAGNOSIS — E039 Hypothyroidism, unspecified: Secondary | ICD-10-CM | POA: Diagnosis not present

## 2021-07-11 DIAGNOSIS — Z9884 Bariatric surgery status: Secondary | ICD-10-CM | POA: Diagnosis not present

## 2021-09-28 DIAGNOSIS — M25571 Pain in right ankle and joints of right foot: Secondary | ICD-10-CM | POA: Diagnosis not present

## 2021-09-28 DIAGNOSIS — M19071 Primary osteoarthritis, right ankle and foot: Secondary | ICD-10-CM | POA: Diagnosis not present

## 2021-10-02 DIAGNOSIS — M79671 Pain in right foot: Secondary | ICD-10-CM | POA: Diagnosis not present

## 2021-10-02 DIAGNOSIS — M25571 Pain in right ankle and joints of right foot: Secondary | ICD-10-CM | POA: Diagnosis not present

## 2021-10-09 DIAGNOSIS — M1460 Charcot's joint, unspecified site: Secondary | ICD-10-CM | POA: Diagnosis not present

## 2021-10-09 DIAGNOSIS — E039 Hypothyroidism, unspecified: Secondary | ICD-10-CM | POA: Diagnosis not present

## 2021-10-09 DIAGNOSIS — F909 Attention-deficit hyperactivity disorder, unspecified type: Secondary | ICD-10-CM | POA: Diagnosis not present

## 2021-11-01 DIAGNOSIS — M14671 Charcot's joint, right ankle and foot: Secondary | ICD-10-CM | POA: Diagnosis not present

## 2021-11-01 DIAGNOSIS — L97509 Non-pressure chronic ulcer of other part of unspecified foot with unspecified severity: Secondary | ICD-10-CM | POA: Diagnosis not present

## 2021-11-02 ENCOUNTER — Other Ambulatory Visit: Payer: Self-pay | Admitting: Orthopaedic Surgery

## 2021-11-02 DIAGNOSIS — M79671 Pain in right foot: Secondary | ICD-10-CM

## 2021-11-08 ENCOUNTER — Other Ambulatory Visit: Payer: BC Managed Care – PPO

## 2021-12-25 ENCOUNTER — Ambulatory Visit
Admission: RE | Admit: 2021-12-25 | Discharge: 2021-12-25 | Disposition: A | Discharge status: Home / Self Care | Payer: BC Managed Care – PPO | Source: Ambulatory Visit | Attending: Orthopaedic Surgery | Admitting: Orthopaedic Surgery

## 2021-12-25 DIAGNOSIS — M79671 Pain in right foot: Secondary | ICD-10-CM | POA: Diagnosis not present

## 2021-12-25 DIAGNOSIS — M7989 Other specified soft tissue disorders: Secondary | ICD-10-CM | POA: Diagnosis not present

## 2021-12-29 DIAGNOSIS — M14671 Charcot's joint, right ankle and foot: Secondary | ICD-10-CM | POA: Diagnosis not present

## 2022-01-09 DIAGNOSIS — Z23 Encounter for immunization: Secondary | ICD-10-CM | POA: Diagnosis not present

## 2022-01-09 DIAGNOSIS — E1165 Type 2 diabetes mellitus with hyperglycemia: Secondary | ICD-10-CM | POA: Diagnosis not present

## 2022-01-09 DIAGNOSIS — M1460 Charcot's joint, unspecified site: Secondary | ICD-10-CM | POA: Diagnosis not present

## 2022-01-09 DIAGNOSIS — F909 Attention-deficit hyperactivity disorder, unspecified type: Secondary | ICD-10-CM | POA: Diagnosis not present

## 2022-01-31 DIAGNOSIS — M7502 Adhesive capsulitis of left shoulder: Secondary | ICD-10-CM | POA: Diagnosis not present

## 2022-02-28 ENCOUNTER — Encounter (HOSPITAL_COMMUNITY): Payer: Self-pay | Admitting: *Deleted

## 2022-03-23 DIAGNOSIS — M1460 Charcot's joint, unspecified site: Secondary | ICD-10-CM | POA: Diagnosis not present

## 2022-03-23 DIAGNOSIS — F909 Attention-deficit hyperactivity disorder, unspecified type: Secondary | ICD-10-CM | POA: Diagnosis not present

## 2022-03-23 DIAGNOSIS — E039 Hypothyroidism, unspecified: Secondary | ICD-10-CM | POA: Diagnosis not present

## 2022-06-25 DIAGNOSIS — E039 Hypothyroidism, unspecified: Secondary | ICD-10-CM | POA: Diagnosis not present

## 2022-06-25 DIAGNOSIS — M1460 Charcot's joint, unspecified site: Secondary | ICD-10-CM | POA: Diagnosis not present

## 2022-06-25 DIAGNOSIS — R5383 Other fatigue: Secondary | ICD-10-CM | POA: Diagnosis not present

## 2022-06-25 DIAGNOSIS — F909 Attention-deficit hyperactivity disorder, unspecified type: Secondary | ICD-10-CM | POA: Diagnosis not present

## 2022-06-25 DIAGNOSIS — E1165 Type 2 diabetes mellitus with hyperglycemia: Secondary | ICD-10-CM | POA: Diagnosis not present

## 2022-09-24 DIAGNOSIS — E039 Hypothyroidism, unspecified: Secondary | ICD-10-CM | POA: Diagnosis not present

## 2022-09-24 DIAGNOSIS — E1165 Type 2 diabetes mellitus with hyperglycemia: Secondary | ICD-10-CM | POA: Diagnosis not present

## 2022-09-24 DIAGNOSIS — F909 Attention-deficit hyperactivity disorder, unspecified type: Secondary | ICD-10-CM | POA: Diagnosis not present

## 2022-12-17 DIAGNOSIS — M79672 Pain in left foot: Secondary | ICD-10-CM | POA: Diagnosis not present

## 2022-12-25 DIAGNOSIS — E1165 Type 2 diabetes mellitus with hyperglycemia: Secondary | ICD-10-CM | POA: Diagnosis not present

## 2022-12-25 DIAGNOSIS — F909 Attention-deficit hyperactivity disorder, unspecified type: Secondary | ICD-10-CM | POA: Diagnosis not present

## 2022-12-25 DIAGNOSIS — E039 Hypothyroidism, unspecified: Secondary | ICD-10-CM | POA: Diagnosis not present

## 2023-03-18 ENCOUNTER — Encounter (HOSPITAL_COMMUNITY): Payer: Self-pay | Admitting: *Deleted

## 2023-12-30 ENCOUNTER — Ambulatory Visit (INDEPENDENT_AMBULATORY_CARE_PROVIDER_SITE_OTHER)

## 2023-12-30 ENCOUNTER — Encounter: Payer: Self-pay | Admitting: Podiatry

## 2023-12-30 ENCOUNTER — Ambulatory Visit: Admitting: Podiatry

## 2023-12-30 VITALS — Ht 75.0 in | Wt 257.0 lb

## 2023-12-30 DIAGNOSIS — L97512 Non-pressure chronic ulcer of other part of right foot with fat layer exposed: Secondary | ICD-10-CM

## 2023-12-30 DIAGNOSIS — E08621 Diabetes mellitus due to underlying condition with foot ulcer: Secondary | ICD-10-CM | POA: Diagnosis not present

## 2023-12-30 DIAGNOSIS — M7751 Other enthesopathy of right foot: Secondary | ICD-10-CM | POA: Diagnosis not present

## 2023-12-30 DIAGNOSIS — M7752 Other enthesopathy of left foot: Secondary | ICD-10-CM

## 2023-12-30 MED ORDER — DOXYCYCLINE HYCLATE 100 MG PO TABS: 100.0000 mg | ORAL_TABLET | Freq: Two times a day (BID) | ORAL | 0 refills | Status: DC

## 2023-12-30 MED ORDER — SILVER SULFADIAZINE 1 % EX CREA: 1.0000 | TOPICAL_CREAM | Freq: Every day | CUTANEOUS | 1 refills | Status: AC

## 2023-12-30 NOTE — Progress Notes (Signed)
 Chief Complaint  Patient presents with   Diabetes    Pt is here due to bilateral foot issues that he is concern about, has a sore on the side of the right great toe, been there for a while and is peeling, has a spot to the left 2nd toe  and callous to the foot, states he has charcot;s joint to the right foot seeing another doctor for this issue.    Subjective:  48 y.o. male with PMHx of diabetes mellitus presenting today for evaluation of a new onset of an ulcer that he believes began today to the right great toe.  Chronic history of Charcot neuroarthropathy to the right foot   Past Medical History:  Diagnosis Date   Depression    Diabetes mellitus without complication (HCC)    Hyperlipidemia    Hypertension    Hypothyroidism    Thyroid  disease     Past Surgical History:  Procedure Laterality Date   GASTRIC ROUX-EN-Y N/A 08/18/2019   Procedure: LAPAROSCOPIC ROUX-EN-Y GASTRIC BYPASS WITH UPPER ENDOSCOPY, ERAS Pathway;  Surgeon: Signe Mitzie LABOR, MD;  Location: [facility];  Service: General;  Laterality: N/A;   HERNIA REPAIR      Allergies  Allergen Reactions   Xigduo Xr [Dapagliflozin Pro-Metformin Er] Other (See Comments)    FOOT ULCERS     RT great toe 12/30/2023  Objective/Physical Exam General: The patient is alert and oriented x3 in no acute distress.  Dermatology:  Wound #1 noted to the plantar aspect of the right great toe measuring approximately 2.0 x 2.0 x 0.2 cm (LxWxD).   To the noted ulceration(s), there is no eschar. There is a moderate amount of slough, fibrin, and necrotic tissue noted. Granulation tissue and wound base is red. There is a minimal amount of serosanguineous drainage noted. There is no exposed bone muscle-tendon ligament or joint. There is no malodor. Periwound integrity is intact. Skin is warm, dry and supple bilateral lower extremities.  Vascular: Palpable pedal pulses bilaterally. No edema or erythema noted. Capillary refill within normal  limits.  Neurological: Light touch and protective threshold diminished bilaterally.   Musculoskeletal Exam: History of Charcot neuroarthropathy right foot.  No prior amputations  Radiographic exam RT foot 12/30/2023: Charcot neuroarthropathy noted throughout the midtarsal joints of the foot with collapse of the arch.  It appears stable and chronic.  No gas within the soft tissue  Assessment: 1.  Ulcer right great toe secondary to diabetes mellitus 2.  Charcot neuroarthropathy right   Plan of Care:  -Patient was evaluated.  X-rays reviewed which appear to be stable -Medically necessary excisional debridement including subcutaneous tissue was performed using a tissue nipper and a chisel blade. Excisional debridement of all the necrotic nonviable tissue down to healthy bleeding viable tissue was performed with post-debridement measurements same as pre-. -The wound was cleansed and dry sterile dressing applied. -Cam boot dispensed.  WBAT -Prescription for Silvadene cream to apply daily to the wound under occlusion with a Band-Aid -Prescription for doxycycline 100 mg twice daily x 10 days.  Although the wound does not seem to be infected this was more of a precautionary measure -Return to clinic 3 weeks   Thresa EMERSON Sar, DPM [facility]  Dr. Thresa EMERSON Sar, DPM    [address]  [place], [place] [address]                Office [phone]  Fax [phone]

## 2024-01-20 ENCOUNTER — Encounter: Payer: Self-pay | Admitting: Podiatry

## 2024-01-20 ENCOUNTER — Ambulatory Visit: Admitting: Podiatry

## 2024-01-20 VITALS — Ht 75.0 in | Wt 257.0 lb

## 2024-01-20 DIAGNOSIS — E08621 Diabetes mellitus due to underlying condition with foot ulcer: Secondary | ICD-10-CM

## 2024-01-20 DIAGNOSIS — L97512 Non-pressure chronic ulcer of other part of right foot with fat layer exposed: Secondary | ICD-10-CM

## 2024-01-20 NOTE — Progress Notes (Unsigned)
   Chief Complaint  Patient presents with   Diabetic Ulcer    Pt is here to f/u on right great toe due to diabetic ulcer, he states that it is not healing well, and continues to drain.    Subjective:  48 y.o. male with PMHx of diabetes mellitus presenting today for follow-up evaluation of an ulcer to the right great toe.  Chronic history of Charcot neuroarthropathy to the right foot   Past Medical History:  Diagnosis Date   Depression    Diabetes mellitus without complication (HCC)    Hyperlipidemia    Hypertension    Hypothyroidism    Thyroid  disease     Past Surgical History:  Procedure Laterality Date   GASTRIC ROUX-EN-Y N/A 08/18/2019   Procedure: LAPAROSCOPIC ROUX-EN-Y GASTRIC BYPASS WITH UPPER ENDOSCOPY, ERAS Pathway;  Surgeon: Signe Mitzie LABOR, MD;  Location: [facility];  Service: General;  Laterality: N/A;   HERNIA REPAIR      Allergies  Allergen Reactions   Xigduo Xr [Dapagliflozin Pro-Metformin Er] Other (See Comments)    FOOT ULCERS     RT great toe 12/30/2023  Objective/Physical Exam General: The patient is alert and oriented x3 in no acute distress.  Dermatology:  Improved. Wound #1 noted to the plantar aspect of the right great toe measuring approximately 1.5 x 1.5 x 0.2 cm (LxWxD).   To the noted ulceration(s), there is no eschar. There is a moderate amount of slough, fibrin, and necrotic tissue noted. Granulation tissue and wound base is red. There is a minimal amount of serosanguineous drainage noted. There is no exposed bone muscle-tendon ligament or joint. There is no malodor. Periwound integrity is intact. Skin is warm, dry and supple bilateral lower extremities.  Vascular: Palpable pedal pulses bilaterally. No edema or erythema noted. Capillary refill within normal limits.  Neurological: Light touch and protective threshold diminished bilaterally.   Musculoskeletal Exam: History of Charcot neuroarthropathy right foot.  No prior  amputations  Radiographic exam RT foot 12/30/2023: Charcot neuroarthropathy noted throughout the midtarsal joints of the foot with collapse of the arch.  It appears stable and chronic.  No gas within the soft tissue  Assessment: 1.  Ulcer right great toe secondary to diabetes mellitus 2.  Charcot neuroarthropathy right   Plan of Care:  -Patient was evaluated.   -Medically necessary excisional debridement including subcutaneous tissue was performed using a tissue nipper and a chisel blade. Excisional debridement of all the necrotic nonviable tissue down to healthy bleeding viable tissue was performed with post-debridement measurements same as pre-. -The wound was cleansed and dry sterile dressing applied. -CAM boot uncomfortable. Postop shoe dispensed. WBAT -Continue Silvadene cream to apply daily to the wound under occlusion with a Band-Aid -Completed doxycycline 100 mg twice daily x 10 days presribed on 12/30/23   -Return to clinic 3 weeks   Thresa EMERSON Sar, DPM [facility]  Dr. Thresa EMERSON Sar, DPM    [address] [place], [place] [address]                Office [phone]  Fax [phone]

## 2024-02-10 ENCOUNTER — Ambulatory Visit: Admitting: Podiatry

## 2024-02-10 ENCOUNTER — Encounter: Payer: Self-pay | Admitting: Podiatry

## 2024-02-10 DIAGNOSIS — L97512 Non-pressure chronic ulcer of other part of right foot with fat layer exposed: Secondary | ICD-10-CM

## 2024-02-10 DIAGNOSIS — E08621 Diabetes mellitus due to underlying condition with foot ulcer: Secondary | ICD-10-CM

## 2024-02-10 NOTE — Progress Notes (Signed)
 Chief Complaint  Patient presents with   Foot Ulcer    Follow up ulcer plantar hallux right    I think its the same. I don't think that shoe really helps either, feels just like my regular shoe.    Subjective:  48 y.o. male with PMHx of diabetes mellitus presenting today for follow-up evaluation of an ulcer to the right great toe.  Chronic history of Charcot neuroarthropathy to the right foot   Past Medical History:  Diagnosis Date   Depression    Diabetes mellitus without complication (HCC)    Hyperlipidemia    Hypertension    Hypothyroidism    Thyroid  disease     Past Surgical History:  Procedure Laterality Date   GASTRIC ROUX-EN-Y N/A 08/18/2019   Procedure: LAPAROSCOPIC ROUX-EN-Y GASTRIC BYPASS WITH UPPER ENDOSCOPY, ERAS Pathway;  Surgeon: Signe Mitzie LABOR, MD;  Location: [facility];  Service: General;  Laterality: N/A;   HERNIA REPAIR      Allergies  Allergen Reactions   Dapagliflozin     Other Reaction(s): foot ulcer   Glipizide     Other Reaction(s): Unknown   Xigduo Xr [Dapagliflozin Pro-Metformin Er] Other (See Comments)    FOOT ULCERS     RT great toe 12/30/2023   RT great toe 02/10/2024  Objective/Physical Exam General: The patient is alert and oriented x3 in no acute distress.  Dermatology:  Improved. Wound #1 noted to the plantar aspect of the right great toe measuring approximately 1.5 x 1.0 x 0.2 cm (LxWxD).   To the noted ulceration(s), there is no eschar. There is a moderate amount of slough, fibrin, and necrotic tissue noted. Granulation tissue and wound base is red. There is a minimal amount of serosanguineous drainage noted. There is no exposed bone muscle-tendon ligament or joint. There is no malodor. Periwound integrity is intact. Skin is warm, dry and supple bilateral lower extremities.  Vascular: Palpable pedal pulses bilaterally. No edema or erythema noted. Capillary refill within normal limits.  Neurological: Light touch and protective  threshold diminished bilaterally.   Musculoskeletal Exam: History of Charcot neuroarthropathy right foot.  No prior amputations  Radiographic exam RT foot 12/30/2023: Charcot neuroarthropathy noted throughout the midtarsal joints of the foot with collapse of the arch.  It appears stable and chronic.  No gas within the soft tissue  Assessment: 1.  Ulcer right great toe secondary to diabetes mellitus 2.  Charcot neuroarthropathy right   Plan of Care:  -Patient was evaluated.   -Medically necessary excisional debridement including subcutaneous tissue was performed using a tissue nipper and a chisel blade. Excisional debridement of all the necrotic nonviable tissue down to healthy bleeding viable tissue was performed with post-debridement measurements same as pre-. -The wound was cleansed and dry sterile dressing applied. - Patient is currently weightbearing in tennis shoes with the offloading dancers pads.  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 Thresa EMERSON Sar, DPM    [address]  [place], [place] [address]                Office [phone]  Fax [phone]

## 2024-03-02 ENCOUNTER — Ambulatory Visit: Admitting: Podiatry

## 2024-03-18 ENCOUNTER — Encounter: Payer: Self-pay | Admitting: Podiatry

## 2024-03-18 ENCOUNTER — Ambulatory Visit

## 2024-03-18 ENCOUNTER — Ambulatory Visit: Admitting: Podiatry

## 2024-03-18 VITALS — Ht 75.0 in | Wt 257.0 lb

## 2024-03-18 DIAGNOSIS — E08621 Diabetes mellitus due to underlying condition with foot ulcer: Secondary | ICD-10-CM | POA: Diagnosis not present

## 2024-03-18 DIAGNOSIS — L97512 Non-pressure chronic ulcer of other part of right foot with fat layer exposed: Secondary | ICD-10-CM

## 2024-03-18 MED ORDER — DOXYCYCLINE HYCLATE 100 MG PO TABS: 100.0000 mg | ORAL_TABLET | Freq: Two times a day (BID) | ORAL | 1 refills | Status: DC

## 2024-03-18 MED ORDER — GENTAMICIN SULFATE 0.1 % EX CREA: 1.0000 | TOPICAL_CREAM | Freq: Two times a day (BID) | CUTANEOUS | 1 refills | Status: DC

## 2024-03-18 MED ORDER — DOXYCYCLINE HYCLATE 100 MG PO TABS: 100.0000 mg | ORAL_TABLET | Freq: Two times a day (BID) | ORAL | 1 refills | Status: AC

## 2024-03-18 MED ORDER — GENTAMICIN SULFATE 0.1 % EX CREA: 1.0000 | TOPICAL_CREAM | Freq: Two times a day (BID) | CUTANEOUS | 1 refills | Status: AC

## 2024-03-18 NOTE — Progress Notes (Signed)
 "  Chief Complaint  Patient presents with   Diabetic Ulcer    Pt is here to f/u on right foot due to ulcer states the area is still open and draining, continues to change bandage everyday.    Subjective:  49 y.o. male with PMHx of diabetes mellitus presenting today for follow-up evaluation of an ulcer to the right great toe.  Chronic history of Charcot neuroarthropathy to the right foot   Past Medical History:  Diagnosis Date   Depression    Diabetes mellitus without complication (HCC)    Hyperlipidemia    Hypertension    Hypothyroidism    Thyroid  disease     Past Surgical History:  Procedure Laterality Date   GASTRIC ROUX-EN-Y N/A 08/18/2019   Procedure: LAPAROSCOPIC ROUX-EN-Y GASTRIC BYPASS WITH UPPER ENDOSCOPY, ERAS Pathway;  Surgeon: Signe Mitzie LABOR, MD;  Location: [facility];  Service: General;  Laterality: N/A;   HERNIA REPAIR      Allergies  Allergen Reactions   Dapagliflozin     Other Reaction(s): foot ulcer   Glipizide     Other Reaction(s): Unknown   Xigduo Xr [Dapagliflozin Pro-Metformin Er] Other (See Comments)    FOOT ULCERS     RT great toe 12/30/2023   RT great toe 02/10/2024     RT great toe 03/18/2024  Objective/Physical Exam General: The patient is alert and oriented x3 in no acute distress.  Dermatology:  Wound #1 noted to the plantar aspect of the right great toe measuring approximately 1.5 x 1.0 x 0.2 cm (LxWxD).   To the noted ulceration(s), there is no eschar. There is a moderate amount of slough, fibrin, and necrotic tissue noted. Granulation tissue and wound base is red. There is a minimal amount of serosanguineous drainage noted. There is no exposed bone muscle-tendon ligament or joint.  Mild malodor noted today. Periwound integrity is intact.  Vascular: Palpable pedal pulses bilaterally. No edema or erythema noted. Capillary refill within normal limits.  Neurological: Light touch and protective threshold diminished bilaterally.    Musculoskeletal Exam: History of Charcot neuroarthropathy right foot.  No prior amputations  Radiographic exam RT foot 03/18/2024: Charcot neuroarthropathy noted throughout the midtarsal joints of the foot with collapse of the arch.  It appears stable and chronic.  No gas within the soft tissue  Assessment: 1.  Ulcer right great toe secondary to diabetes mellitus 2.  Charcot neuroarthropathy right   Plan of Care:  -Patient was evaluated.  X-rays reviewed -Due to the progressive nature of the wound with mild malodor now I did go ahead and order MRI toes right wo contrast -Medically necessary excisional debridement including subcutaneous tissue was performed using a tissue nipper and a chisel blade. Excisional debridement of all the necrotic nonviable tissue down to healthy bleeding viable tissue was performed with post-debridement measurements same as pre-. -The wound was cleansed and dry sterile dressing applied. - Patient is currently weightbearing in tennis shoes with the offloading dancers pads.  They were adjusted today to more efficiently offload pressure from the great toe - Discontinue Silvadene  cream.  Prescription for gentamicin  cream apply twice daily - Refill prescription of doxycycline  100 mg twice daily x 14 days.  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 Thresa EMERSON Sar, DPM    [address]  [place], [place] [address]                Office [phone]  Fax [phone]     "

## 2024-03-19 ENCOUNTER — Encounter: Payer: Self-pay | Admitting: Podiatry

## 2024-03-20 ENCOUNTER — Encounter: Payer: Self-pay | Admitting: Podiatry

## 2024-03-20 ENCOUNTER — Encounter (HOSPITAL_COMMUNITY): Payer: Self-pay | Admitting: *Deleted

## 2024-03-20 ENCOUNTER — Other Ambulatory Visit: Payer: Self-pay

## 2024-03-22 ENCOUNTER — Inpatient Hospital Stay: Admission: RE | Admit: 2024-03-22 | Discharge: 2024-03-22 | Payer: Self-pay | Attending: Podiatry

## 2024-03-22 DIAGNOSIS — E08621 Diabetes mellitus due to underlying condition with foot ulcer: Secondary | ICD-10-CM

## 2024-04-01 ENCOUNTER — Other Ambulatory Visit: Payer: Self-pay | Admitting: Orthopaedic Surgery

## 2024-04-01 ENCOUNTER — Ambulatory Visit: Admitting: Podiatry

## 2024-04-02 ENCOUNTER — Encounter (HOSPITAL_COMMUNITY): Payer: Self-pay | Admitting: Orthopaedic Surgery

## 2024-04-02 ENCOUNTER — Other Ambulatory Visit: Payer: Self-pay

## 2024-04-02 NOTE — Progress Notes (Signed)
 SDW CALL  Patient was given pre-op instructions over the phone. The opportunity was given for the patient to ask questions. No further questions asked. Patient verbalized understanding of instructions given.   PCP - Kip Righter, MD  Cardiologist - none  PPM/ICD - denies Device Orders -  Rep Notified -   Chest x-ray - na EKG - DOS Stress Test - over 20 years ago for chest pain,but says it was due to stress. No issues since. ECHO - DOS Cardiac Cath - denies.  Sleep Study - denies CPAP - no  Last dose of Mounjaro was 03/26/24. Patient will hold prior to surgery Fasting Blood Sugar - pt doesn't check his blood sugar. His last A1C on 02/25/24 was 5.4 Checks Blood Sugar _____ times a day  Blood Thinner Instructions:na Aspirin Instructions:na  ERAS Protcol -clear liquids until 0430 PRE-SURGERY Ensure or G2- no  COVID TEST- na  Anesthesia review: no  Patient denies shortness of breath, fever, cough and chest pain over the phone call  You may drink clear liquids until  Oral Hygiene is also important to reduce your risk of infection.  Remember - BRUSH YOUR TEETH THE MORNING OF SURGERY WITH YOUR REGULAR TOOTHPASTE

## 2024-04-07 ENCOUNTER — Encounter (HOSPITAL_COMMUNITY)
Admission: RE | Disposition: A | Discharge status: Home / Self Care | Payer: Self-pay | Source: Home / Self Care | Attending: Orthopaedic Surgery

## 2024-04-07 ENCOUNTER — Ambulatory Visit (HOSPITAL_COMMUNITY): Admitting: Anesthesiology

## 2024-04-07 ENCOUNTER — Other Ambulatory Visit: Payer: Self-pay

## 2024-04-07 ENCOUNTER — Encounter (HOSPITAL_COMMUNITY): Payer: Self-pay | Admitting: Orthopaedic Surgery

## 2024-04-07 ENCOUNTER — Ambulatory Visit (HOSPITAL_COMMUNITY)
Admission: RE | Admit: 2024-04-07 | Discharge: 2024-04-07 | Disposition: A | Discharge status: Home / Self Care | Attending: Orthopaedic Surgery | Admitting: Orthopaedic Surgery

## 2024-04-07 DIAGNOSIS — L97519 Non-pressure chronic ulcer of other part of right foot with unspecified severity: Secondary | ICD-10-CM | POA: Insufficient documentation

## 2024-04-07 DIAGNOSIS — E039 Hypothyroidism, unspecified: Secondary | ICD-10-CM | POA: Insufficient documentation

## 2024-04-07 DIAGNOSIS — I10 Essential (primary) hypertension: Secondary | ICD-10-CM

## 2024-04-07 DIAGNOSIS — E11621 Type 2 diabetes mellitus with foot ulcer: Secondary | ICD-10-CM | POA: Diagnosis not present

## 2024-04-07 DIAGNOSIS — F32A Depression, unspecified: Secondary | ICD-10-CM | POA: Diagnosis not present

## 2024-04-07 DIAGNOSIS — M869 Osteomyelitis, unspecified: Secondary | ICD-10-CM

## 2024-04-07 DIAGNOSIS — Z7984 Long term (current) use of oral hypoglycemic drugs: Secondary | ICD-10-CM | POA: Diagnosis not present

## 2024-04-07 DIAGNOSIS — E1169 Type 2 diabetes mellitus with other specified complication: Secondary | ICD-10-CM | POA: Diagnosis not present

## 2024-04-07 DIAGNOSIS — Z87891 Personal history of nicotine dependence: Secondary | ICD-10-CM | POA: Diagnosis not present

## 2024-04-07 HISTORY — DX: Attention-deficit hyperactivity disorder, unspecified type: F90.9

## 2024-04-07 LAB — CBC
HCT: 41.9 % (ref 39.0–52.0)
Hemoglobin: 14.7 g/dL (ref 13.0–17.0)
MCH: 29.3 pg (ref 26.0–34.0)
MCHC: 35.1 g/dL (ref 30.0–36.0)
MCV: 83.5 fL (ref 80.0–100.0)
Platelets: 271 10*3/uL (ref 150–400)
RBC: 5.02 MIL/uL (ref 4.22–5.81)
RDW: 13.4 % (ref 11.5–15.5)
WBC: 7.2 10*3/uL (ref 4.0–10.5)
nRBC: 0 % (ref 0.0–0.2)

## 2024-04-07 LAB — BASIC METABOLIC PANEL WITH GFR
Anion gap: 6 (ref 5–15)
BUN: 17 mg/dL (ref 6–20)
CO2: 29 mmol/L (ref 22–32)
Calcium: 9.3 mg/dL (ref 8.9–10.3)
Chloride: 105 mmol/L (ref 98–111)
Creatinine, Ser: 0.74 mg/dL (ref 0.61–1.24)
GFR, Estimated: >60 mL/min
Glucose, Bld: 124 mg/dL — ABNORMAL HIGH (ref 70–99)
Potassium: 4.6 mmol/L (ref 3.5–5.1)
Sodium: 140 mmol/L (ref 135–145)

## 2024-04-07 LAB — GLUCOSE, CAPILLARY
Glucose-Capillary: 116 mg/dL — ABNORMAL HIGH (ref 70–99)
Glucose-Capillary: 110 mg/dL — ABNORMAL HIGH (ref 70–99)

## 2024-04-07 MED ORDER — PROPOFOL 10 MG/ML IV BOLUS
INTRAVENOUS | Status: DC | PRN
  Administered 2024-04-07: 30 mg via INTRAVENOUS

## 2024-04-07 MED ORDER — MIDAZOLAM HCL 2 MG/2ML IJ SOLN
INTRAMUSCULAR | Status: AC
  Filled 2024-04-07: qty 2

## 2024-04-07 MED ORDER — DEXAMETHASONE SOD PHOSPHATE PF 10 MG/ML IJ SOLN
INTRAMUSCULAR | Status: AC
  Filled 2024-04-07: qty 1

## 2024-04-07 MED ORDER — DEXAMETHASONE SOD PHOSPHATE PF 10 MG/ML IJ SOLN
INTRAMUSCULAR | Status: DC | PRN
  Administered 2024-04-07: 5 mg via INTRAVENOUS

## 2024-04-07 MED ORDER — FENTANYL CITRATE (PF) 100 MCG/2ML IJ SOLN: 25.0000 ug | INTRAMUSCULAR | Status: DC | PRN

## 2024-04-07 MED ORDER — AMISULPRIDE (ANTIEMETIC) 5 MG/2ML IV SOLN: 10.0000 mg | Freq: Once | INTRAVENOUS | Status: DC | PRN

## 2024-04-07 MED ORDER — PROPOFOL 1000 MG/100ML IV EMUL
INTRAVENOUS | Status: AC
  Filled 2024-04-07: qty 100

## 2024-04-07 MED ORDER — CEFAZOLIN SODIUM-DEXTROSE 2-4 GM/100ML-% IV SOLN
2.0000 g | INTRAVENOUS | Status: AC
  Administered 2024-04-07: 2 g via INTRAVENOUS
  Filled 2024-04-07: qty 100

## 2024-04-07 MED ORDER — OXYCODONE HCL 5 MG PO TABS: 5.0000 mg | ORAL_TABLET | Freq: Three times a day (TID) | ORAL | 0 refills | Status: AC | PRN

## 2024-04-07 MED ORDER — FENTANYL CITRATE (PF) 100 MCG/2ML IJ SOLN
INTRAMUSCULAR | Status: AC
  Filled 2024-04-07: qty 2

## 2024-04-07 MED ORDER — FENTANYL CITRATE (PF) 100 MCG/2ML IJ SOLN
INTRAMUSCULAR | Status: DC | PRN
  Administered 2024-04-07 (×2): 50 ug via INTRAVENOUS

## 2024-04-07 MED ORDER — PROPOFOL 500 MG/50ML IV EMUL
INTRAVENOUS | Status: DC | PRN
  Administered 2024-04-07: 100 ug/kg/min via INTRAVENOUS

## 2024-04-07 MED ORDER — ORAL CARE MOUTH RINSE: 15.0000 mL | Freq: Once | OROMUCOSAL | Status: AC

## 2024-04-07 MED ORDER — MIDAZOLAM HCL (PF) 2 MG/2ML IJ SOLN
INTRAMUSCULAR | Status: DC | PRN
  Administered 2024-04-07: 2 mg via INTRAVENOUS

## 2024-04-07 MED ORDER — LIDOCAINE 2% (20 MG/ML) 5 ML SYRINGE
INTRAMUSCULAR | Status: AC
  Filled 2024-04-07: qty 5

## 2024-04-07 MED ORDER — BUPIVACAINE HCL (PF) 0.25 % IJ SOLN
INTRAMUSCULAR | Status: AC
  Filled 2024-04-07: qty 30

## 2024-04-07 MED ORDER — SODIUM CHLORIDE 0.9 % IV SOLN: INTRAVENOUS | Status: DC | PRN

## 2024-04-07 MED ORDER — ONDANSETRON HCL 4 MG/2ML IJ SOLN
INTRAMUSCULAR | Status: AC
  Filled 2024-04-07: qty 2

## 2024-04-07 MED ORDER — POVIDONE-IODINE 7.5 % EX SOLN
Freq: Once | CUTANEOUS | Status: DC
  Filled 2024-04-07: qty 118

## 2024-04-07 MED ORDER — OXYCODONE HCL 5 MG PO TABS: 5.0000 mg | ORAL_TABLET | Freq: Once | ORAL | Status: DC | PRN

## 2024-04-07 MED ORDER — LACTATED RINGERS IV SOLN: INTRAVENOUS | Status: DC

## 2024-04-07 MED ORDER — ACETAMINOPHEN 10 MG/ML IV SOLN: 1000.0000 mg | Freq: Once | INTRAVENOUS | Status: DC | PRN

## 2024-04-07 MED ORDER — BUPIVACAINE-EPINEPHRINE (PF) 0.25% -1:200000 IJ SOLN
INTRAMUSCULAR | Status: AC
  Filled 2024-04-07: qty 30

## 2024-04-07 MED ORDER — PROPOFOL 10 MG/ML IV BOLUS
INTRAVENOUS | Status: AC
  Filled 2024-04-07: qty 20

## 2024-04-07 MED ORDER — BUPIVACAINE HCL (PF) 0.25 % IJ SOLN
INTRAMUSCULAR | Status: DC | PRN
  Administered 2024-04-07: 10 mL

## 2024-04-07 MED ORDER — LIDOCAINE 2% (20 MG/ML) 5 ML SYRINGE
INTRAMUSCULAR | Status: DC | PRN
  Administered 2024-04-07: 60 mg via INTRAVENOUS

## 2024-04-07 MED ORDER — CHLORHEXIDINE GLUCONATE 0.12 % MT SOLN
15.0000 mL | Freq: Once | OROMUCOSAL | Status: AC
  Administered 2024-04-07: 15 mL via OROMUCOSAL
  Filled 2024-04-07: qty 15

## 2024-04-07 MED ORDER — ONDANSETRON HCL 4 MG/2ML IJ SOLN
INTRAMUSCULAR | Status: DC | PRN
  Administered 2024-04-07: 4 mg via INTRAVENOUS

## 2024-04-07 MED ORDER — OXYCODONE HCL 5 MG/5ML PO SOLN: 5.0000 mg | Freq: Once | ORAL | Status: DC | PRN

## 2024-04-07 MED ORDER — INSULIN ASPART 100 UNIT/ML IJ SOLN
0.0000 [IU] | INTRAMUSCULAR | Status: DC | PRN
  Filled 2024-04-07: qty 0.14

## 2024-04-07 NOTE — Op Note (Signed)
" °  Derek Prince male 49 y.o. 04/07/2024  PreOperative Diagnosis: Right hallux osteomyelitis with ulceration  PostOperative Diagnosis: Same  PROCEDURE: Right hallux amputation  SURGEON: Lonni Pae, MD  ASSISTANT: None  ANESTHESIA: MAC with local infiltration of quarter percent Marcaine  plain, 10 cc and digital block fashion  FINDINGS: Chronic plantar ulceration about the hallux on the right side with osteomyelitis  IMPLANTS: None  INDICATIONS:48 y.o. male has diabetes with neuropathy and developed a chronic ulceration on the plantar aspect of the hallux.  He had osteomyelitis noted on advanced imaging and was indicated for surgery.  He had failed conservative treatment the form of wound debridement, oral antibiotics.   Patient understood the risks, benefits and alternatives to surgery which include but are not limited to wound healing complications, infection, nonunion, malunion, need for further surgery as well as damage to surrounding structures. They also understood the potential for continued pain in that there were no guarantees of acceptable outcome After weighing these risks the patient opted to proceed with surgery.  PROCEDURE: Patient was identified in the preoperative holding area.  The right foot was marked by myself.  Consent was signed by myself and the patient.  Block was performed by anesthesia in the preoperative holding area.  Patient was taken to the operative suite and placed supine on the operative table.  MAC anesthesia was induced without difficulty. Bump was placed under the operative hip and bone foam was used.  All bony prominences were well padded.  Right leg was prepped and draped in the usual sterile fashion.  Preoperative antibiotics were given.  Surgical timeout was performed.  We began by inspecting the soft tissue.  Then we began by making a incision along the dorsal aspect of the toe around the level of the interphalangeal joint.  This was  then taken circumferentially around the tip of the toe with care to remove completely the ulcerative portion of the plantar skin.  The incision was carried down to bone.  We then able to disarticulate the interphalangeal joint.  Bone from the distal phalanx was sent for culture.  Then it was noted that there was not adequate soft tissue for repair and so we took a majority of the proximal phalanx of the toe using a bone cutter.  This bone was discarded.  Then the wound was irrigated copiously with normal saline.  Then the skin was closed overlying the stump using a 2-0 nylon suture in a tension-free fashion.  There was good bleeding at the skin edges.  No residual infection within the visible soft tissues.  Then a soft dressing was placed.  No tourniquet was used.  Patient was awakened from anesthesia and taken recovery in stable condition.  No complications.  He tolerated procedure well.   POST OPERATIVE INSTRUCTIONS: Heel weightbearing in a postoperative shoe Follow-up in 2 weeks for wound check. Sutures to remain until skin close Continue outpatient antibiotics.  TOURNIQUET TIME: No tourniquet  BLOOD LOSS: Minimal         DRAINS: none         SPECIMEN: none       COMPLICATIONS:  * No complications entered in OR log *         Disposition: PACU - hemodynamically stable.         Condition: stable  "

## 2024-04-07 NOTE — Anesthesia Postprocedure Evaluation (Signed)
"   Anesthesia Post Note  Patient: Derek Prince  Procedure(s) Performed: AMPUTATION, TOE (Right: Toe)     Patient location during evaluation: PACU Anesthesia Type: MAC Level of consciousness: awake Pain management: pain level controlled Vital Signs Assessment: post-procedure vital signs reviewed and stable Respiratory status: spontaneous breathing, nonlabored ventilation and respiratory function stable Cardiovascular status: blood pressure returned to baseline and stable Postop Assessment: no apparent nausea or vomiting Anesthetic complications: no   No notable events documented.  Last Vitals:  Vitals:   04/07/24 0815 04/07/24 0830  BP: 117/67 123/75  Pulse: 77 82  Resp: 16 16  Temp:  36.6 C  SpO2: 99% 97%    Last Pain:  Vitals:   04/07/24 0830  TempSrc:   PainSc: 0-No pain                 Derek Prince      "

## 2024-04-07 NOTE — Transfer of Care (Signed)
 Immediate Anesthesia Transfer of Care Note  Patient: Derek Prince  Procedure(s) Performed: AMPUTATION, TOE (Right: Toe)  Patient Location: PACU  Anesthesia Type:MAC  Level of Consciousness: awake, alert , and oriented  Airway & Oxygen Therapy: Patient Spontanous Breathing and Patient connected to face mask oxygen  Post-op Assessment: Report given to RN and Post -op Vital signs reviewed and stable  Post vital signs: Reviewed and stable  Last Vitals:  Vitals Value Taken Time  BP 125/85 04/07/24 08:07  Temp    Pulse 81 04/07/24 08:10  Resp 16 04/07/24 08:10  SpO2 99 % 04/07/24 08:10  Vitals shown include unfiled device data.  Last Pain:  Vitals:   04/07/24 0626  TempSrc:   PainSc: 5          Complications: No notable events documented.

## 2024-04-07 NOTE — Anesthesia Preprocedure Evaluation (Addendum)
"                                    Anesthesia Evaluation  Patient identified by MRN, date of birth, ID band Patient awake    Reviewed: Allergy & Precautions, NPO status , Patient's Chart, lab work & pertinent test results  Airway Mallampati: II  TM Distance: >3 FB Neck ROM: Full    Dental no notable dental hx.    Pulmonary former smoker   Pulmonary exam normal        Cardiovascular hypertension, Normal cardiovascular exam     Neuro/Psych  PSYCHIATRIC DISORDERS  Depression    negative neurological ROS     GI/Hepatic negative GI ROS, Neg liver ROS,,,  Endo/Other  diabetesHypothyroidism  Patient on GLP-1 Agonist  Renal/GU negative Renal ROS     Musculoskeletal negative musculoskeletal ROS (+)    Abdominal   Peds  Hematology negative hematology ROS (+)   Anesthesia Other Findings OSTEOMYELITIS OF RIGHT GREAT TOE  Reproductive/Obstetrics                              Anesthesia Physical Anesthesia Plan  ASA: 2  Anesthesia Plan: MAC   Post-op Pain Management:    Induction:   PONV Risk Score and Plan: 1 and Ondansetron , Dexamethasone , Propofol  infusion, Midazolam  and Treatment may vary due to age or medical condition  Airway Management Planned:   Additional Equipment:   Intra-op Plan:   Post-operative Plan:   Informed Consent: I have reviewed the patients History and Physical, chart, labs and discussed the procedure including the risks, benefits and alternatives for the proposed anesthesia with the patient or authorized representative who has indicated his/her understanding and acceptance.     Dental advisory given  Plan Discussed with: CRNA  Anesthesia Plan Comments:         Anesthesia Quick Evaluation  "

## 2024-04-07 NOTE — Discharge Instructions (Signed)
 DR. Susa Simmonds [facility] SURGERY POST-OP INSTRUCTIONS   Pain Management The numbing medicine and your leg will last around 18 hours, take a dose of your pain medicine as soon as you feel it wearing off to avoid rebound pain. Keep your foot elevated above heart level.  Make sure that your heel hangs free ('floats'). Take all prescribed medication as directed. If taking narcotic pain medication you may want to use an over-the-counter stool softener to avoid constipation. You may take over-the-counter NSAIDs (ibuprofen, naproxen, etc.) as well as over-the-counter acetaminophen as directed on the packaging as a supplement for your pain and may also use it to wean away from the prescription medication.  Activity Heel WB in post operative shoe. Keep dressing in place  First Postoperative Visit Your first postop visit will be at least 2 weeks after surgery.  This should be scheduled when you schedule surgery. If you do not have a postoperative visit scheduled please call [phone] to schedule an appointment. At the appointment your incision will be evaluated for suture removal, x-rays will be obtained if necessary.  General Instructions Swelling is very common after foot and ankle surgery.  It often takes 3 months for the foot and ankle to begin to feel comfortable.  Some amount of swelling will persist for 6-12 months. DO NOT change the dressing.  If there is a problem with the dressing (too tight, loose, gets wet, etc.) please contact Dr. Donnie Mesa office. DO NOT get the dressing wet.  For showers you can use an over-the-counter cast cover or wrap a washcloth around the top of your dressing and then cover it with a plastic bag and tape it to your leg. DO NOT soak the incision (no tubs, pools, bath, etc.) until you have approval from Dr. Susa Simmonds.  Contact Dr. Garret Reddish office or go to Emergency Room if: Temperature above 101 F. Increasing pain that is unresponsive to pain medication or  elevation Excessive redness or swelling in your foot Dressing problems - excessive bloody drainage, looseness or tightness, or if dressing gets wet Develop pain, swelling, warmth, or discoloration of your calf

## 2024-04-07 NOTE — H&P (Signed)
 PREOPERATIVE H&P  Chief Complaint: Right hallux osteomyelitis in the setting of diabetes with neuropathy  HPI: Derek Prince is a 49 y.o. male who presents for preoperative history and physical with a diagnosis of right hallux osteomyelitis in the setting of diabetes with neuropathy.  He has had a chronic ulceration on the plantar aspect of his hallux that has failed conservative treatment.  MRI scan recently demonstrated osteo of the distal phalanx and partially of the proximal phalanx.  He is here today for surgery. Symptoms are rated as moderate to severe, and have been worsening.  This is significantly impairing activities of daily living.  He has elected for surgical management.   Past Medical History:  Diagnosis Date   ADHD (attention deficit hyperactivity disorder)    Depression    Diabetes mellitus without complication (HCC)    Hyperlipidemia    Hypertension    Hypothyroidism    Thyroid  disease    Past Surgical History:  Procedure Laterality Date   GASTRIC ROUX-EN-Y N/A 08/18/2019   Procedure: LAPAROSCOPIC ROUX-EN-Y GASTRIC BYPASS WITH UPPER ENDOSCOPY, ERAS Pathway;  Surgeon: Signe Mitzie LABOR, MD;  Location: [facility];  Service: General;  Laterality: N/A;   HERNIA REPAIR     Social History   Socioeconomic History   Marital status: Single    Spouse name: Not on file   Number of children: Not on file   Years of education: Not on file   Highest education level: Not on file  Occupational History   Not on file  Tobacco Use   Smoking status: Former    Current packs/day: 0.00    Types: Cigarettes    Quit date: 2015    Years since quitting: 11.0   Smokeless tobacco: Never  Vaping Use   Vaping status: Never Used  Substance and Sexual Activity   Alcohol use: Never   Drug use: No   Sexual activity: Not on file  Other Topics Concern   Not on file  Social History Narrative   Not on file   Social Drivers of Health   Tobacco Use: Medium Risk (04/07/2024)   Patient  History    Smoking Tobacco Use: Former    Smokeless Tobacco Use: Never    Passive Exposure: Not on Actuary Strain: Not on file  Food Insecurity: Not on file  Transportation Needs: Not on file  Physical Activity: Not on file  Stress: Not on file  Social Connections: Not on file  Depression (EYV7-0): Not on file  Alcohol Screen: Not on file  Housing: Not on file  Utilities: Not on file  Health Literacy: Not on file   Family History  Problem Relation Age of Onset   Hyperlipidemia Other    Diabetes Other    Allergies[1] Prior to Admission medications  Medication Sig Start Date End Date Taking? Authorizing Provider  amoxicillin -clavulanate (AUGMENTIN ) 875-125 MG tablet Take 1 tablet by mouth every 12 (twelve) hours. 04/01/24  Yes [provider]  doxycycline  (VIBRA -TABS) 100 MG tablet Take 1 tablet (100 mg total) by mouth 2 (two) times daily. 03/18/24  Yes Janit Thresa HERO, DPM  levothyroxine  (SYNTHROID ) 200 MCG tablet Take 200 mcg by mouth daily. 04/14/18  Yes [provider]  silver  sulfADIAZINE  (SILVADENE ) 1 % cream Apply 1 Application topically daily. 12/30/23  Yes Janit Thresa HERO, DPM  tirzepatide [facility]) 15 MG/0.5ML Pen Inject 15 mg into the skin once a week. 02/08/24  Yes [provider]  VYVANSE  70 MG capsule Take 70  mg by mouth daily. 04/17/18  Yes [provider]  gentamicin  cream (GARAMYCIN ) 0.1 % Apply 1 Application topically 2 (two) times daily. Patient not taking: Reported on 04/02/2024 03/18/24   Janit Thresa HERO, DPM     Positive ROS: All other systems have been reviewed and were otherwise negative with the exception of those mentioned in the HPI and as above.  Physical Exam:  Vitals:   04/07/24 0556  BP: (!) 153/91  Pulse: 76  Resp: 20  Temp: 97.9 F (36.6 C)  SpO2: 100%   General: Alert, no acute distress Cardiovascular: No pedal edema Respiratory: No cyanosis, no use of accessory musculature GI: No organomegaly,  abdomen is soft and non-tender Skin: No lesions in the area of chief complaint Neurologic: Sensation intact distally Psychiatric: Patient is competent for consent with normal mood and affect Lymphatic: No axillary or cervical lymphadenopathy  MUSCULOSKELETAL: Right foot demonstrates ulceration of the plantar aspect of the hallux near the interphalangeal joint.  There is full-thickness skin loss in the area.  He has swelling and redness about the toe.  Baseline sensory deficits.  Foot is warm and well-perfused.  Assessment: Right hallux plantar ulceration with osteomyelitis   Plan: Plan for hallux amputation either partial or through the MP joint.  He will be discharged from the recovery room.  We discussed the risks, benefits and alternatives of surgery which include but are not limited to wound healing complications, infection, nonunion, malunion, need for further surgery, damage to surrounding structures and continued pain.  They understand there is no guarantees to an acceptable outcome.  After weighing these risks they opted to proceed with surgery.     Lonni JONELLE Pae, MD    04/07/2024 6:52 AM      [1]  Allergies Allergen Reactions   Dapagliflozin     Other Reaction(s): foot ulcer   Glipizide     Other Reaction(s): Unknown   Xigduo Xr [Dapagliflozin Pro-Metformin Er] Other (See Comments)    FOOT ULCERS

## 2024-04-08 ENCOUNTER — Encounter (HOSPITAL_COMMUNITY): Payer: Self-pay | Admitting: Orthopaedic Surgery

## 2024-04-12 LAB — AEROBIC/ANAEROBIC CULTURE W GRAM STAIN (SURGICAL/DEEP WOUND)
Culture: NO GROWTH
Gram Stain: NONE SEEN
